# Patient Record
Sex: Female | Born: 1965 | Race: White | Hispanic: No | Marital: Married | Smoking: Former smoker
Health system: Southern US, Community
[De-identification: ages and names within clinical notes are randomized; demographics above are authoritative.]

## PROBLEM LIST (undated history)

## (undated) DIAGNOSIS — M797 Fibromyalgia: Secondary | ICD-10-CM

## (undated) DIAGNOSIS — F419 Anxiety disorder, unspecified: Secondary | ICD-10-CM

## (undated) DIAGNOSIS — M1990 Unspecified osteoarthritis, unspecified site: Secondary | ICD-10-CM

## (undated) DIAGNOSIS — I10 Essential (primary) hypertension: Secondary | ICD-10-CM

## (undated) DIAGNOSIS — Z9289 Personal history of other medical treatment: Secondary | ICD-10-CM

## (undated) DIAGNOSIS — K219 Gastro-esophageal reflux disease without esophagitis: Secondary | ICD-10-CM

## (undated) DIAGNOSIS — K589 Irritable bowel syndrome without diarrhea: Secondary | ICD-10-CM

## (undated) HISTORY — PX: ABDOMINAL HYSTERECTOMY: SHX81

## (undated) HISTORY — DX: Gastro-esophageal reflux disease without esophagitis: K21.9

## (undated) HISTORY — PX: CHOLECYSTECTOMY: SHX55

## (undated) HISTORY — DX: Irritable bowel syndrome, unspecified: K58.9

## (undated) HISTORY — PX: WISDOM TOOTH EXTRACTION: SHX21

## (undated) HISTORY — PX: TONSILLECTOMY: SUR1361

---

## 2001-10-05 ENCOUNTER — Ambulatory Visit (HOSPITAL_COMMUNITY): Admission: RE | Admit: 2001-10-05 | Discharge: 2001-10-05 | Payer: Self-pay | Admitting: General Surgery

## 2005-10-29 ENCOUNTER — Ambulatory Visit: Payer: Self-pay | Admitting: Gastroenterology

## 2005-12-23 ENCOUNTER — Ambulatory Visit: Payer: Self-pay | Admitting: Gastroenterology

## 2011-09-30 ENCOUNTER — Encounter (INDEPENDENT_AMBULATORY_CARE_PROVIDER_SITE_OTHER): Payer: Self-pay | Admitting: *Deleted

## 2011-11-08 ENCOUNTER — Encounter (INDEPENDENT_AMBULATORY_CARE_PROVIDER_SITE_OTHER): Payer: Self-pay | Admitting: *Deleted

## 2011-11-08 ENCOUNTER — Other Ambulatory Visit (INDEPENDENT_AMBULATORY_CARE_PROVIDER_SITE_OTHER): Payer: Self-pay | Admitting: *Deleted

## 2011-11-08 ENCOUNTER — Encounter (INDEPENDENT_AMBULATORY_CARE_PROVIDER_SITE_OTHER): Payer: Self-pay | Admitting: Internal Medicine

## 2011-11-08 ENCOUNTER — Ambulatory Visit (INDEPENDENT_AMBULATORY_CARE_PROVIDER_SITE_OTHER): Payer: 59 | Admitting: Internal Medicine

## 2011-11-08 VITALS — BP 98/60 | HR 72 | Temp 97.7°F | Ht 65.0 in | Wt 139.6 lb

## 2011-11-08 DIAGNOSIS — K219 Gastro-esophageal reflux disease without esophagitis: Secondary | ICD-10-CM

## 2011-11-08 DIAGNOSIS — K5900 Constipation, unspecified: Secondary | ICD-10-CM

## 2011-11-08 NOTE — Patient Instructions (Addendum)
Amitiza daily for your constipation. EGD. The risks and benefits such as perforation, bleeding, and infection were reviewed with the patient and is agreeable.  Stop the Miralax

## 2011-11-08 NOTE — Progress Notes (Signed)
Subjective:     Patient ID: Tiffany Stout, female   DOB: 05-26-1965, 45 y.o.   MRN: [id number]  HPI Referred to our office for acid reflux.  She says she has a bad taste in her mouth all the time.  She tells me she has food coming up into her esophagus. She is presently taking Zantac 300mg  BID and Dexilant daily and is still having acid reflux. She has been on several PPIs which have not helped. Acid reflux keeps her up at night.  She has frequent nausea.  Appetite is not good. No weight loss.  She does have early satiety. She is avoiding spicy foods, and chocolate. She is avoiding carbonated drinks.   She also takes Miralax. She tells me she stays constipated all the time.  No melena or bright red rectal bleeding.  She usually has a BM once every 1-2 weeks. She has been constipated for many years. Review of Systems drr hpi Current Outpatient Prescriptions  Medication Sig Dispense Refill  . dexlansoprazole (DEXILANT) 60 MG capsule Take 60 mg by mouth daily.      Marland Kitchen PARoxetine (PAXIL) 20 MG tablet Take 20 mg by mouth every morning.      . ranitidine (ZANTAC) 300 MG tablet Take 300 mg by mouth at bedtime.       Past Medical History  Diagnosis Date  . GERD (gastroesophageal reflux disease)   \ Past Surgical History  Procedure Date  . Cholecystectomy    History   Social History  . Marital Status: Single    Spouse Name: N/A    Number of Children: N/A  . Years of Education: N/A   Occupational History  . Not on file.   Social History Main Topics  . Smoking status: Never Smoker   . Smokeless tobacco: Not on file  . Alcohol Use: No  . Drug Use: No  . Sexually Active: Not on file   Other Topics Concern  . Not on file   Social History Narrative  . No narrative on file   Family Status  Relation Status Death Age  . Mother Alive     good health  . Father Deceased     Alzheimer's  . Brother Deceased     age 38 of a heart condition        Objective:   Physical Exam    Filed Vitals:   11/08/11 1112  BP: 98/60  Pulse: 72  Temp: 97.7 F (36.5 C)  Height: 5\' 5"  (1.651 m)  Weight: 139 lb 9.6 oz (63.322 kg)  Alert and oriented. Skin warm and dry. Oral mucosa is moist.   . Sclera anicteric, conjunctivae is pink. Thyroid not enlarged. No cervical lymphadenopathy. Lungs clear. Heart regular rate and rhythm.  Abdomen is soft. Bowel sounds are positive. No hepatomegaly. No abdominal masses felt. No tenderness.  No edema to lower extremities. Patient is alert and oriented.       Assessment:    GERD, uncontrolled. Has been on multiple PPIs in the past.   Constipation, chronic. Taking Miralax and MOM. Plan:    EGD. The risks and benefits such as perforation, bleeding, and infection were reviewed with the patient and is agreeable.  Amitiza given to patient.     I discussed this case with Dr. Karilyn Cota. If EGD is normal, will schedule a PH study.

## 2011-11-10 ENCOUNTER — Encounter (HOSPITAL_COMMUNITY): Payer: Self-pay | Admitting: Pharmacy Technician

## 2011-11-11 ENCOUNTER — Encounter (HOSPITAL_COMMUNITY)
Admission: RE | Disposition: A | Discharge status: Home / Self Care | Payer: Self-pay | Source: Ambulatory Visit | Attending: Internal Medicine

## 2011-11-11 ENCOUNTER — Encounter (HOSPITAL_COMMUNITY): Payer: Self-pay | Admitting: *Deleted

## 2011-11-11 ENCOUNTER — Ambulatory Visit (HOSPITAL_COMMUNITY)
Admission: RE | Admit: 2011-11-11 | Discharge: 2011-11-11 | Disposition: A | Discharge status: Home / Self Care | Payer: 59 | Source: Ambulatory Visit | Attending: Internal Medicine | Admitting: Internal Medicine

## 2011-11-11 DIAGNOSIS — R12 Heartburn: Secondary | ICD-10-CM | POA: Insufficient documentation

## 2011-11-11 DIAGNOSIS — K219 Gastro-esophageal reflux disease without esophagitis: Secondary | ICD-10-CM

## 2011-11-11 DIAGNOSIS — R110 Nausea: Secondary | ICD-10-CM | POA: Insufficient documentation

## 2011-11-11 DIAGNOSIS — K2940 Chronic atrophic gastritis without bleeding: Secondary | ICD-10-CM | POA: Insufficient documentation

## 2011-11-11 DIAGNOSIS — K449 Diaphragmatic hernia without obstruction or gangrene: Secondary | ICD-10-CM | POA: Insufficient documentation

## 2011-11-11 DIAGNOSIS — K2960 Other gastritis without bleeding: Secondary | ICD-10-CM

## 2011-11-11 HISTORY — PX: ESOPHAGOGASTRODUODENOSCOPY: SHX5428

## 2011-11-11 SURGERY — EGD (ESOPHAGOGASTRODUODENOSCOPY)
Anesthesia: Moderate Sedation

## 2011-11-11 MED ORDER — MIDAZOLAM HCL 5 MG/5ML IJ SOLN
INTRAMUSCULAR | Status: DC | PRN
  Administered 2011-11-11 (×5): 2 mg via INTRAVENOUS

## 2011-11-11 MED ORDER — SUCRALFATE 1 GM/10ML PO SUSP: 1.0000 g | Freq: Four times a day (QID) | ORAL | Status: DC

## 2011-11-11 MED ORDER — STERILE WATER FOR IRRIGATION IR SOLN
Status: DC | PRN
  Administered 2011-11-11: 15:00:00

## 2011-11-11 MED ORDER — METOCLOPRAMIDE HCL 10 MG PO TABS: 10.0000 mg | ORAL_TABLET | Freq: Three times a day (TID) | ORAL | Status: DC

## 2011-11-11 MED ORDER — MIDAZOLAM HCL 5 MG/5ML IJ SOLN
INTRAMUSCULAR | Status: AC
  Filled 2011-11-11: qty 10

## 2011-11-11 MED ORDER — MEPERIDINE HCL 50 MG/ML IJ SOLN
INTRAMUSCULAR | Status: AC
  Filled 2011-11-11: qty 1

## 2011-11-11 MED ORDER — BUTAMBEN-TETRACAINE-BENZOCAINE 2-2-14 % EX AERO
INHALATION_SPRAY | CUTANEOUS | Status: DC | PRN
  Administered 2011-11-11: 2 via TOPICAL

## 2011-11-11 MED ORDER — SODIUM CHLORIDE 0.45 % IV SOLN
INTRAVENOUS | Status: DC
  Administered 2011-11-11: 1000 mL via INTRAVENOUS

## 2011-11-11 MED ORDER — MEPERIDINE HCL 25 MG/ML IJ SOLN
INTRAMUSCULAR | Status: DC | PRN
  Administered 2011-11-11 (×2): 25 mg via INTRAVENOUS

## 2011-11-11 NOTE — Op Note (Signed)
EGD PROCEDURE REPORT  PATIENT:  Tiffany Stout  MR#:  [id number] Birthdate:  [birth year], 46 y.o., female Endoscopist:  Dr. Malissa Hippo, MD Referred By:  Dr. Ignatius Specking, MD Procedure Date: 11/11/2011  Procedure:   EGD  Indications:  Patient is 46 year old Caucasian female  who presents with recurrent heartburn and regurgitation. She is not responding to therapy for GERD. She has had cholecystectomy few years ago.           Informed Consent:  The risks, benefits, alternatives & imponderables which include, but are not limited to, bleeding, infection, perforation, drug reaction and potential missed lesion have been reviewed.  The potential for biopsy, lesion removal, esophageal dilation, etc. have also been discussed.  Questions have been answered.  All parties agreeable.  Please see history & physical in medical record for more information.  Medications:  Demerol 50 mg IV Versed 10 mg IV Cetacaine spray topically for oropharyngeal anesthesia  Description of procedure:  The endoscope was introduced through the mouth and advanced to the second portion of the duodenum without difficulty or limitations. The mucosal surfaces were surveyed very carefully during advancement of the scope and upon withdrawal.  Findings:  Esophagus:  Mucosa of the esophagus was normal. Focal erythema noted at GE junction but no erosions or ulcers present. GEJ:  33 cm Hiatus:  35 cm Stomach:  Stomach was empty and distended very well with insufflation. Folds in the proximal stomach were normal. Examination of mucosa at body was normal. Patchy erythema and granularity noted at antrum but no erosions or ulcers present. Pyloric channel was patent. Angularis fundus and cardia were examined by retroflexing the scope and were normal. Duodenum:  Normal bulbar and post bulbar mucosa.  Therapeutic/Diagnostic Maneuvers Performed:  None   Complications:  None  Impression: Small sliding hiatal hernia along with  mild changes of reflux esophagitis limited to GE junction. Nonerosive antral gastritis.  Recommendations:  Continue anti-reflux measures. Continued dexilant but discontinue ranitidine. Carafate 1 g by mouth a.c. and each bedtime. Metoclopromide 10 mg by mouth a.c. H. pylori serology.  REHMAN,NAJEEB U  11/11/2011  3:45 PM  CC: Dr. Ignatius Specking., MD & Dr. Bonnetta Barry ref. provider found

## 2011-11-11 NOTE — H&P (Signed)
Tiffany Stout is an 46 y.o. female.   Chief Complaint: Patient is for esophagogastroduodenoscopy. HPI: Patient is 46 year old Caucasian female presented intractable symptoms of retrosternal burning frequent regurgitation. She was diagnosed with GERD about 3 years ago. She was on Prilosec for a while at work but her symptoms have been intractable for the last 6 months. She does not smoke cigarettes. She's been watching her diet as recommended. He denies dysphagia or vomiting but she does complain of nausea. She also complains of constipation. She says Amitiza is helping. She denies weight loss melena or rectal bleeding.  Past Medical History  Diagnosis Date  . GERD (gastroesophageal reflux disease)     Past Surgical History  Procedure Date  . Cholecystectomy   . Abdominal hysterectomy     History reviewed. No pertinent family history. Social History:  reports that she has never smoked. She does not have any smokeless tobacco history on file. She reports that she does not drink alcohol or use illicit drugs.  Allergies: No Known Allergies  Medications Prior to Admission  Medication Sig Dispense Refill  . dexlansoprazole (DEXILANT) 60 MG capsule Take 60 mg by mouth daily.      Marland Kitchen PARoxetine (PAXIL) 20 MG tablet Take 20 mg by mouth every morning.      . ranitidine (ZANTAC) 300 MG tablet Take 300 mg by mouth 2 (two) times daily.       . traZODone (DESYREL) 50 MG tablet Take 50 mg by mouth at bedtime.        No results found for this or any previous visit (from the past 48 hour(s)). No results found.  ROS  Blood pressure 132/93, pulse 80, temperature 98.2 F (36.8 C), temperature source Oral, resp. rate 18, height 5\' 5"  (1.651 m), weight 139 lb (63.05 kg), SpO2 98.00%. Physical Exam  Constitutional: She appears well-developed and well-nourished.  HENT:  Mouth/Throat: Oropharynx is clear and moist.  Eyes: Conjunctivae normal are normal. No scleral icterus.  Neck: No thyromegaly  present.  Cardiovascular: Normal rate, regular rhythm and normal heart sounds.   No murmur heard. Respiratory: Effort normal and breath sounds normal.  GI: She exhibits no distension and no mass. There is no tenderness.  Musculoskeletal: She exhibits no edema.  Lymphadenopathy:    She has no cervical adenopathy.  Neurological: She is alert.  Skin: Skin is warm and dry.     Assessment/Plan Refractory GERD. Diagnostic EGD.  Ra Pfiester U 11/11/2011, 3:19 PM

## 2011-11-12 LAB — H. PYLORI ANTIBODY, IGG: H Pylori IgG: 0.45 {ISR}

## 2011-11-13 ENCOUNTER — Telehealth: Payer: Self-pay | Admitting: Internal Medicine

## 2011-11-13 NOTE — Telephone Encounter (Signed)
Husband called me this afternoon; pt started metoclopramide after recent EGD; he reports pt has become "jittery" and "bouncing off the wall" after starting med; not functional - had to leave work today because of Sx; I recommended pt stop metoclopramide and never take it again- follow up with Dr. Karilyn Cota the first of next week

## 2011-11-15 ENCOUNTER — Ambulatory Visit (INDEPENDENT_AMBULATORY_CARE_PROVIDER_SITE_OTHER): Payer: Self-pay | Admitting: Internal Medicine

## 2011-11-17 ENCOUNTER — Encounter (HOSPITAL_COMMUNITY): Payer: Self-pay | Admitting: Internal Medicine

## 2011-11-17 ENCOUNTER — Encounter (INDEPENDENT_AMBULATORY_CARE_PROVIDER_SITE_OTHER): Payer: Self-pay

## 2011-11-22 ENCOUNTER — Telehealth (INDEPENDENT_AMBULATORY_CARE_PROVIDER_SITE_OTHER): Payer: Self-pay | Admitting: *Deleted

## 2011-11-22 NOTE — Telephone Encounter (Signed)
Tiffany Stout called and would like to know how her procedure went on 11/11/11. Her husband didn't remember what Dr. Karilyn Cota had said and has not received the results.  The return phone number is [phone].

## 2011-11-24 NOTE — Telephone Encounter (Signed)
Patient was called at home. Husband states that she has been calling numerous times but gets answering machines. Refer to patient's documents to see that messages have been left for the patient. I called patient's cell phone and spoke with her and went over procedure,findings.recommendations and the H-Pylori Serology. She was also given my number to call if she had other questions.

## 2012-02-14 ENCOUNTER — Telehealth (INDEPENDENT_AMBULATORY_CARE_PROVIDER_SITE_OTHER): Payer: Self-pay | Admitting: Internal Medicine

## 2012-02-14 NOTE — Telephone Encounter (Signed)
Called requesting Amitiza samples. Given samples last visit in October. Rx written for Amitiza one a day with 5 refills. Dorene Ar

## 2012-11-18 ENCOUNTER — Emergency Department (HOSPITAL_COMMUNITY)
Admission: EM | Admit: 2012-11-18 | Discharge: 2012-11-18 | Disposition: A | Discharge status: Home / Self Care | Payer: 59 | Attending: Emergency Medicine | Admitting: Emergency Medicine

## 2012-11-18 ENCOUNTER — Encounter (HOSPITAL_COMMUNITY): Payer: Self-pay | Admitting: Emergency Medicine

## 2012-11-18 ENCOUNTER — Emergency Department (HOSPITAL_COMMUNITY): Payer: 59

## 2012-11-18 DIAGNOSIS — F411 Generalized anxiety disorder: Secondary | ICD-10-CM | POA: Insufficient documentation

## 2012-11-18 DIAGNOSIS — M545 Low back pain, unspecified: Secondary | ICD-10-CM | POA: Insufficient documentation

## 2012-11-18 DIAGNOSIS — M549 Dorsalgia, unspecified: Secondary | ICD-10-CM

## 2012-11-18 DIAGNOSIS — E876 Hypokalemia: Secondary | ICD-10-CM | POA: Insufficient documentation

## 2012-11-18 DIAGNOSIS — Z8719 Personal history of other diseases of the digestive system: Secondary | ICD-10-CM | POA: Insufficient documentation

## 2012-11-18 LAB — COMPREHENSIVE METABOLIC PANEL
Albumin: 4.2 g/dL (ref 3.5–5.2)
Alkaline Phosphatase: 86 U/L (ref 39–117)
BUN: 7 mg/dL (ref 6–23)
Creatinine, Ser: 0.56 mg/dL (ref 0.50–1.10)
GFR calc Af Amer: >90 mL/min (ref >90)
Glucose, Bld: 94 mg/dL (ref 70–99)
Potassium: 3.2 mEq/L — ABNORMAL LOW (ref 3.5–5.1)
Total Bilirubin: 0.6 mg/dL (ref 0.3–1.2)
Total Protein: 8 g/dL (ref 6.0–8.3)

## 2012-11-18 LAB — URINALYSIS, ROUTINE W REFLEX MICROSCOPIC
Bilirubin Urine: NEGATIVE
Glucose, UA: NEGATIVE mg/dL
Ketones, ur: NEGATIVE mg/dL
Nitrite: NEGATIVE
Specific Gravity, Urine: 1.01 (ref 1.005–1.030)
pH: 6 (ref 5.0–8.0)

## 2012-11-18 LAB — CBC WITH DIFFERENTIAL/PLATELET
Basophils Relative: 0 % (ref 0–1)
Eosinophils Absolute: 0.1 10*3/uL (ref 0.0–0.7)
HCT: 41.9 % (ref 36.0–46.0)
Hemoglobin: 14.1 g/dL (ref 12.0–15.0)
Lymphs Abs: 1.8 10*3/uL (ref 0.7–4.0)
MCH: 31.7 pg (ref 26.0–34.0)
MCHC: 33.7 g/dL (ref 30.0–36.0)
MCV: 94.2 fL (ref 78.0–100.0)
Monocytes Absolute: 0.5 10*3/uL (ref 0.1–1.0)
Monocytes Relative: 9 % (ref 3–12)
Neutrophils Relative %: 56 % (ref 43–77)
RBC: 4.45 MIL/uL (ref 3.87–5.11)

## 2012-11-18 LAB — URINE MICROSCOPIC-ADD ON

## 2012-11-18 LAB — LIPASE, BLOOD: Lipase: 21 U/L (ref 11–59)

## 2012-11-18 MED ORDER — SODIUM CHLORIDE 0.9 % IV SOLN
INTRAVENOUS | Status: DC
  Administered 2012-11-18: 21:00:00 via INTRAVENOUS

## 2012-11-18 MED ORDER — IOHEXOL 300 MG/ML  SOLN
100.0000 mL | Freq: Once | INTRAMUSCULAR | Status: AC | PRN
  Administered 2012-11-18: 100 mL via INTRAVENOUS

## 2012-11-18 MED ORDER — LORAZEPAM 2 MG/ML IJ SOLN
1.0000 mg | Freq: Once | INTRAMUSCULAR | Status: AC
  Administered 2012-11-18: 1 mg via INTRAVENOUS
  Filled 2012-11-18: qty 1

## 2012-11-18 MED ORDER — HYDROCODONE-ACETAMINOPHEN 5-325 MG PO TABS
1.0000 | ORAL_TABLET | Freq: Once | ORAL | Status: AC
  Administered 2012-11-18: 1 via ORAL
  Filled 2012-11-18: qty 1

## 2012-11-18 MED ORDER — HYDROCODONE-ACETAMINOPHEN 5-325 MG PO TABS: 1.0000 | ORAL_TABLET | ORAL | Status: DC | PRN

## 2012-11-18 MED ORDER — POTASSIUM CHLORIDE CRYS ER 20 MEQ PO TBCR: 20.0000 meq | EXTENDED_RELEASE_TABLET | Freq: Two times a day (BID) | ORAL | Status: DC

## 2012-11-18 MED ORDER — IOHEXOL 300 MG/ML  SOLN
50.0000 mL | Freq: Once | INTRAMUSCULAR | Status: AC | PRN
  Administered 2012-11-18: 50 mL via ORAL

## 2012-11-18 MED ORDER — KETOROLAC TROMETHAMINE 30 MG/ML IJ SOLN
30.0000 mg | Freq: Once | INTRAMUSCULAR | Status: AC
  Administered 2012-11-18: 30 mg via INTRAVENOUS
  Filled 2012-11-18: qty 1

## 2012-11-18 NOTE — ED Notes (Signed)
To room with discharge instructions.  Patient asking for something else for pain.  States meds earlier "just knocked the edge off the pain"  MD - Pickering informed.

## 2012-11-18 NOTE — ED Notes (Signed)
Bilateral flank pain. Unable to sleep at night because pain worsens at night. Pain x 1 week. States "a little nausea, but not bad" Denies urinary or any other symptoms.

## 2012-11-18 NOTE — ED Provider Notes (Signed)
CT scan reviewed with patient. Has renal cysts and some thoracic degeneration but no clear cause of pain. Will discharge home with pain medicine. We'll also give some potassium for her hypokalemia. His been asked about pain management, which may be a good idea for the patient.  Juliet Rude. Rubin Payor, MD 11/18/12 2258

## 2012-11-18 NOTE — ED Provider Notes (Signed)
CSN: [id number]     Arrival date & time 11/18/12  1802 History   First MD Initiated Contact with Patient 11/18/12 2001     Chief Complaint  Patient presents with  . Flank Pain   (Consider location/radiation/quality/duration/timing/severity/associated sxs/prior Treatment) HPI  Patient reports for the past 2 weeks she's had bilateral back pain that radiates around to her upper abdomen and lower chest area and along the sides of her abdomen. She states the pain is constant. She states however it hurts more at night. She states nothing makes it feel worse including changing positions, deep breathing, coughing. She states using a heating pad only helps temporarily. She states the pain is mainly dull but at times can be sharp. She's had nausea without vomiting. She has had loss of appetite and has lost 4 pounds. She has diarrhea but she has chronic underlying IBS and that is not changed. She denies fever, dysuria, hematuria, cough, shortness of breath, rectal bleeding. She states she does have some frequency. She states she's had this before and was told she had degenerative disc disease of her back. She saw her family practice doctor 8 days ago and was given a anti-inflammatory without relief. Husband states she's under a lot of stress at which point she states she's not. However he points out she is going to be losing her job of 20 years. On further discussion he states she's had this pain "for years and that normally hydrocodone works well for her but her doctor will not write it for her anymore".   PCP Dr Sherril Croon  Past Medical History  Diagnosis Date  . GERD (gastroesophageal reflux disease)    Past Surgical History  Procedure Laterality Date  . Cholecystectomy    . Abdominal hysterectomy    . Esophagogastroduodenoscopy  11/11/2011    Procedure: ESOPHAGOGASTRODUODENOSCOPY (EGD);  Surgeon: Malissa Hippo, MD;  Location: [facility];  Service: Endoscopy;  Laterality: N/A;  315   No family  history on file. History  Substance Use Topics  . Smoking status: Never Smoker   . Smokeless tobacco: Not on file  . Alcohol Use: No   Lives at home Lives with spouse Employed part time and states they are doing away with her job after 20 years.    OB History   Grav Para Term Preterm Abortions TAB SAB Ect Mult Living                 Review of Systems  All other systems reviewed and are negative.    Allergies  Review of patient's allergies indicates no known allergies.  Home Medications   Current Outpatient Rx  Name  Route  Sig  Dispense  Refill  . traZODone (DESYREL) 50 MG tablet   Oral   Take 50 mg by mouth at bedtime.          BP 123/96  Pulse 72  Temp(Src) 98.1 F (36.7 C) (Oral)  Resp 16  Ht 5\' 5"  (1.651 m)  Wt 126 lb (57.153 kg)  BMI 20.97 kg/m2  SpO2 100%  Vital signs normal   Physical Exam  Nursing note and vitals reviewed. Constitutional: She is oriented to person, place, and time. She appears well-developed and well-nourished.  Non-toxic appearance. She does not appear ill. No distress.  HENT:  Head: Normocephalic and atraumatic.  Right Ear: External ear normal.  Left Ear: External ear normal.  Nose: Nose normal. No mucosal edema or rhinorrhea.  Mouth/Throat: Oropharynx is clear and moist and  mucous membranes are normal. No dental abscesses or uvula swelling.  Eyes: Conjunctivae and EOM are normal. Pupils are equal, round, and reactive to light.  Neck: Normal range of motion and full passive range of motion without pain. Neck supple.  Cardiovascular: Normal rate, regular rhythm and normal heart sounds.  Exam reveals no gallop and no friction rub.   No murmur heard. Pulmonary/Chest: Effort normal and breath sounds normal. No respiratory distress. She has no wheezes. She has no rhonchi. She has no rales. She exhibits no tenderness and no crepitus.  Patient has mild tenderness of her lower rib cage bilaterally without crepitance or bruising.   Abdominal: Soft. Normal appearance and bowel sounds are normal. She exhibits no distension. There is no tenderness. There is no rebound and no guarding.  Nontender abdomen to palpation  Musculoskeletal: Normal range of motion. She exhibits no edema and no tenderness.  Moves all extremities well.  Nontender spine to palpation including thoracic and lumbar. She is nontender over the SI joints. She has free range of motion of her waist and back without any pain. She indicates she has some discomfort in her paraspinous muscles in the lumbar area but they do not hurt on range of motion.  Neurological: She is alert and oriented to person, place, and time. She has normal strength. No cranial nerve deficit.  Skin: Skin is warm, dry and intact. No rash noted. No erythema. No pallor.  Psychiatric: Her speech is normal and behavior is normal. Her mood appears anxious.    ED Course  Procedures (including critical care time) Medications  0.9 %  sodium chloride infusion ( Intravenous New Bag/Given 11/18/12 2044)  ketorolac (TORADOL) 30 MG/ML injection 30 mg (30 mg Intravenous Given 11/18/12 2046)  LORazepam (ATIVAN) injection 1 mg (1 mg Intravenous Given 11/18/12 2045)  iohexol (OMNIPAQUE) 300 MG/ML solution 50 mL (50 mLs Oral Contrast Given 11/18/12 2137)  iohexol (OMNIPAQUE) 300 MG/ML solution 100 mL (100 mLs Intravenous Contrast Given 11/18/12 2137)    [place] database shows only has 2 scripts for clonazepam but not in the past 3 months.  Pt turned over to Dr Rubin Payor at change of shift to get CT results.    Labs Review Results for orders placed during the hospital encounter of 11/18/12  URINALYSIS, ROUTINE W REFLEX MICROSCOPIC      Result Value Range   Color, Urine ORANGE (*) YELLOW   APPearance CLEAR  CLEAR   Specific Gravity, Urine 1.010  1.005 - 1.030   pH 6.0  5.0 - 8.0   Glucose, UA NEGATIVE  NEGATIVE mg/dL   Hgb urine dipstick SMALL (*) NEGATIVE   Bilirubin Urine NEGATIVE  NEGATIVE    Ketones, ur NEGATIVE  NEGATIVE mg/dL   Protein, ur NEGATIVE  NEGATIVE mg/dL   Urobilinogen, UA 0.2  0.0 - 1.0 mg/dL   Nitrite NEGATIVE  NEGATIVE   Leukocytes, UA NEGATIVE  NEGATIVE  CBC WITH DIFFERENTIAL      Result Value Range   WBC 5.5  4.0 - 10.5 K/uL   RBC 4.45  3.87 - 5.11 MIL/uL   Hemoglobin 14.1  12.0 - 15.0 g/dL   HCT 16.1  09.6 - 04.5 %   MCV 94.2  78.0 - 100.0 fL   MCH 31.7  26.0 - 34.0 pg   MCHC 33.7  30.0 - 36.0 g/dL   RDW 40.9  81.1 - 91.4 %   Platelets 171  150 - 400 K/uL   Neutrophils Relative % 56  43 -  77 %   Neutro Abs 3.1  1.7 - 7.7 K/uL   Lymphocytes Relative 33  12 - 46 %   Lymphs Abs 1.8  0.7 - 4.0 K/uL   Monocytes Relative 9  3 - 12 %   Monocytes Absolute 0.5  0.1 - 1.0 K/uL   Eosinophils Relative 1  0 - 5 %   Eosinophils Absolute 0.1  0.0 - 0.7 K/uL   Basophils Relative 0  0 - 1 %   Basophils Absolute 0.0  0.0 - 0.1 K/uL  COMPREHENSIVE METABOLIC PANEL      Result Value Range   Sodium 138  135 - 145 mEq/L   Potassium 3.2 (*) 3.5 - 5.1 mEq/L   Chloride 101  96 - 112 mEq/L   CO2 26  19 - 32 mEq/L   Glucose, Bld 94  70 - 99 mg/dL   BUN 7  6 - 23 mg/dL   Creatinine, Ser 4.78  0.50 - 1.10 mg/dL   Calcium 9.3  8.4 - 29.5 mg/dL   Total Protein 8.0  6.0 - 8.3 g/dL   Albumin 4.2  3.5 - 5.2 g/dL   AST 21  0 - 37 U/L   ALT 15  0 - 35 U/L   Alkaline Phosphatase 86  39 - 117 U/L   Total Bilirubin 0.6  0.3 - 1.2 mg/dL   GFR calc non Af Amer >90  >90 mL/min   GFR calc Af Amer >90  >90 mL/min  LIPASE, BLOOD      Result Value Range   Lipase 21  11 - 59 U/L  URINE MICROSCOPIC-ADD ON      Result Value Range   Squamous Epithelial / LPF FEW (*) RARE   WBC, UA 3-6  <3 WBC/hpf   RBC / HPF 3-6  <3 RBC/hpf   Laboratory interpretation all normal except mild hypokalemia    Imaging Review CT [facility] pending.     MDM   1. Back pain   2. Hypokalemia with normal acid-base balance     Disposition per Dr Lorene Dy, MD, Franz Dell,  MD 11/18/12 2208

## 2014-10-06 IMAGING — CT CT ABD-PELV W/ CM
2 of 4 series · 13 of 36 positions shown, 16 images · IV contrast (Omnipaque 300)
Comparison: None.

CLINICAL DATA: Bilateral flank and upper abdominal pain. Back pain.

EXAM:
CT CHEST, ABDOMEN, AND PELVIS WITH CONTRAST
TECHNIQUE: Multidetector CT imaging of the chest, abdomen and pelvis was
performed following the standard protocol during bolus
administration of intravenous contrast.
CONTRAST:  100mL OMNIPAQUE IOHEXOL 300 MG/ML SOLN, 50mL OMNIPAQUE
IOHEXOL 300 MG/ML SOLN

[Series 2: cap with 5.0 b40f · axial · 0.68mm/px · z∈[-580,-25]mm · 10 of 125 slices shown, 13 images]
[im 7/125  mediastinal]
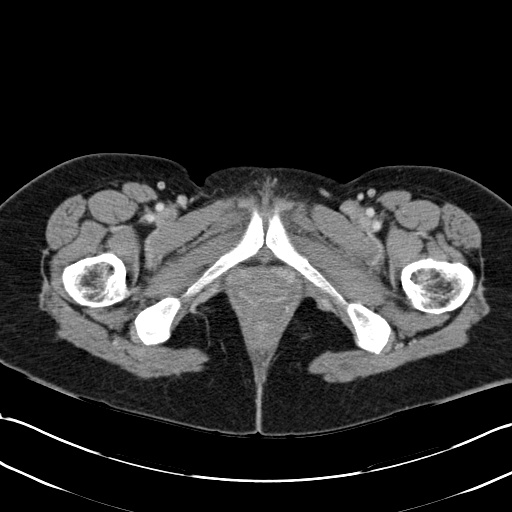
[im 7/125  lung]
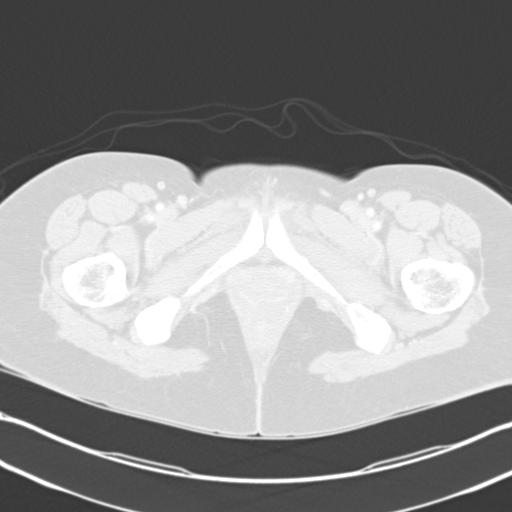
[im 19/125  lung]
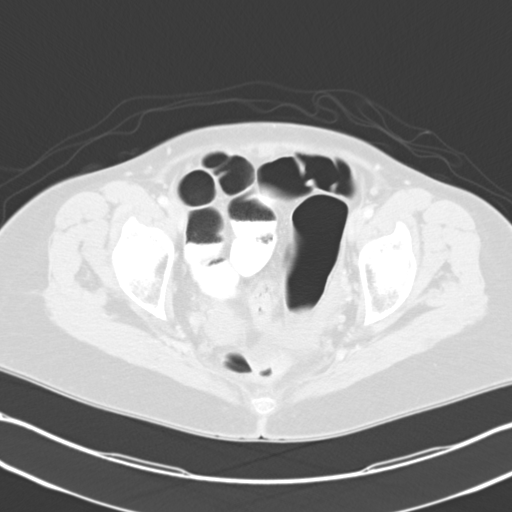
[im 32/125  lung]
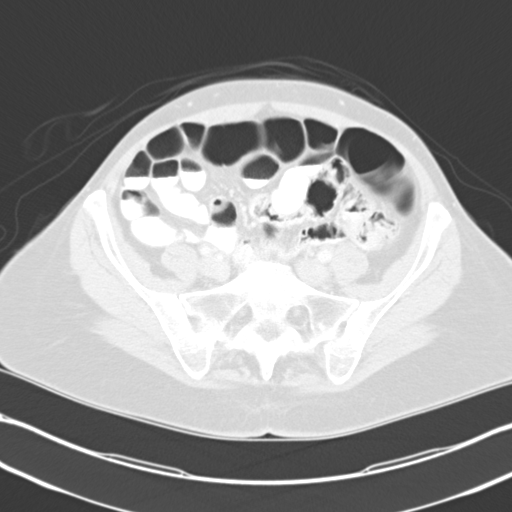
[im 44/125  lung]
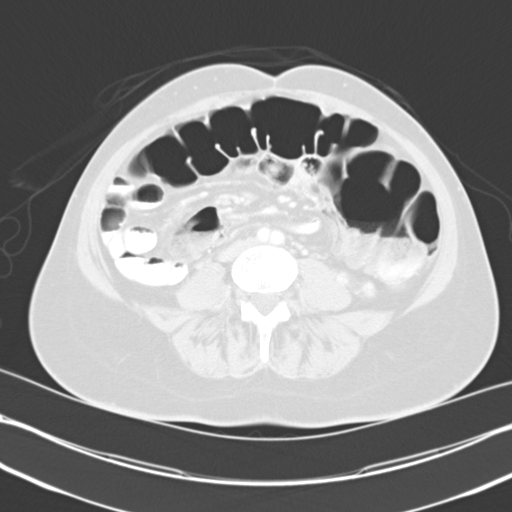
[im 56/125  mediastinal]
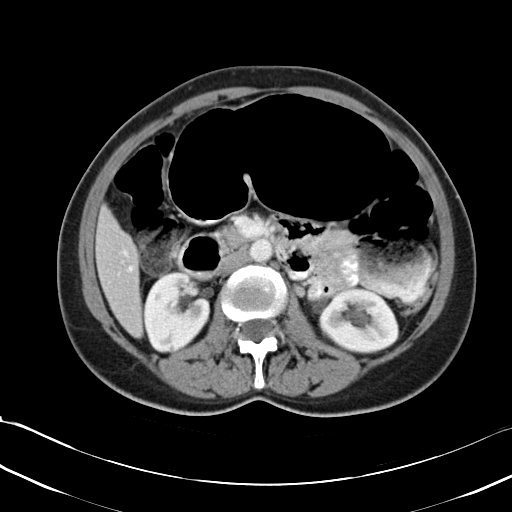
[im 56/125  lung]
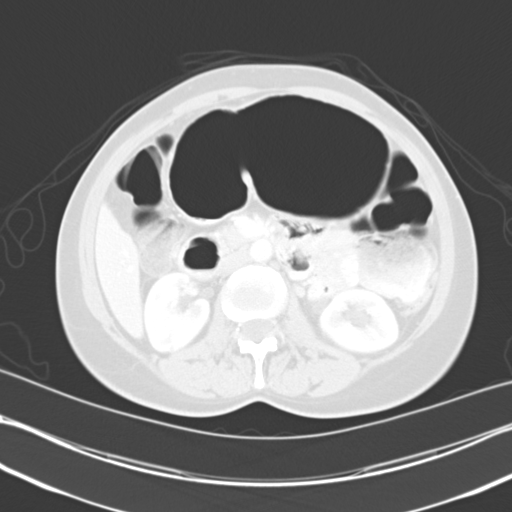
[im 69/125  lung]
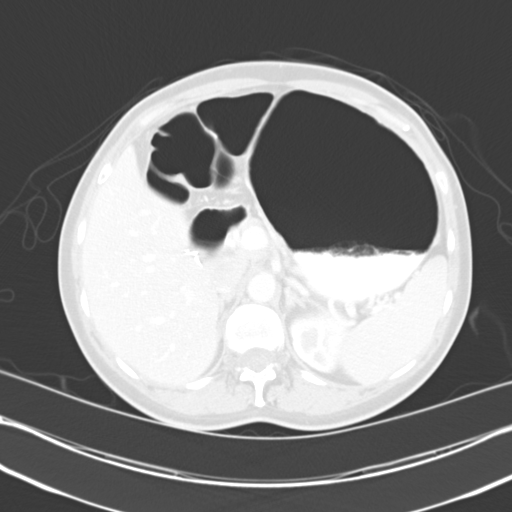
[im 81/125  lung]
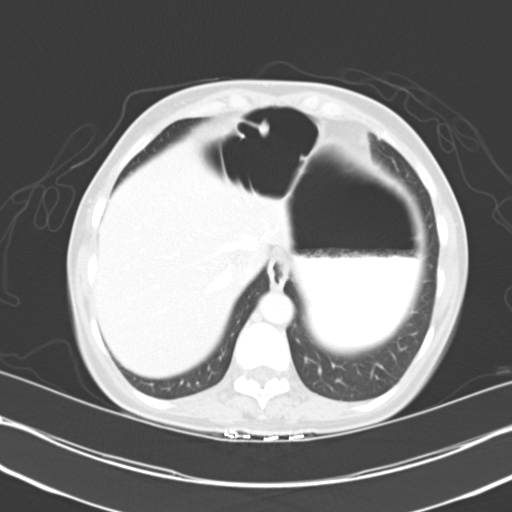
[im 94/125  lung]
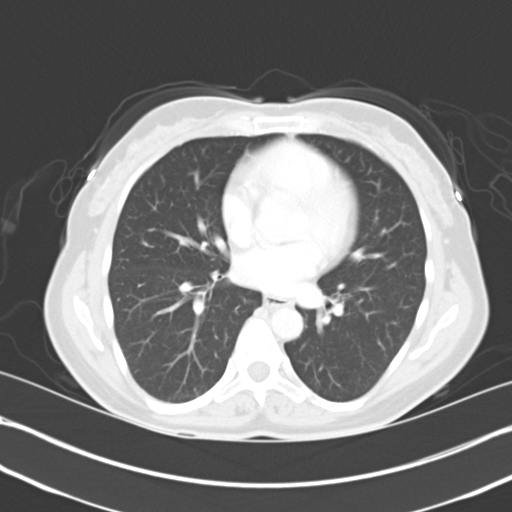
[im 106/125  mediastinal]
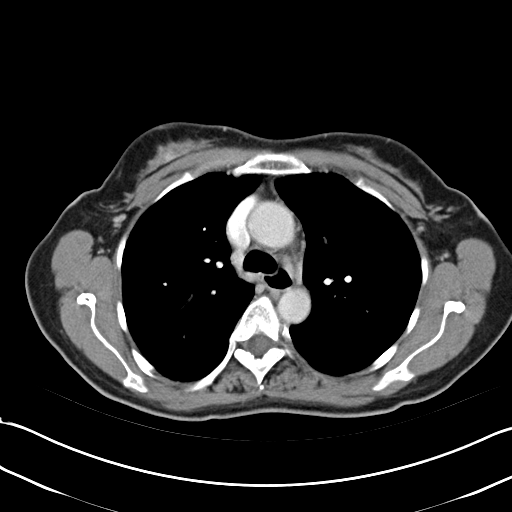
[im 106/125  lung]
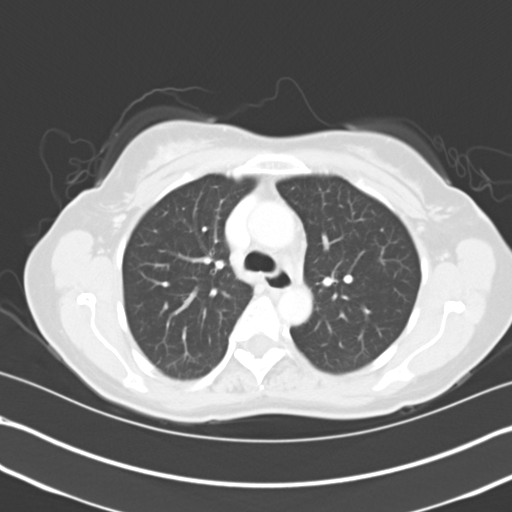
[im 118/125  lung]
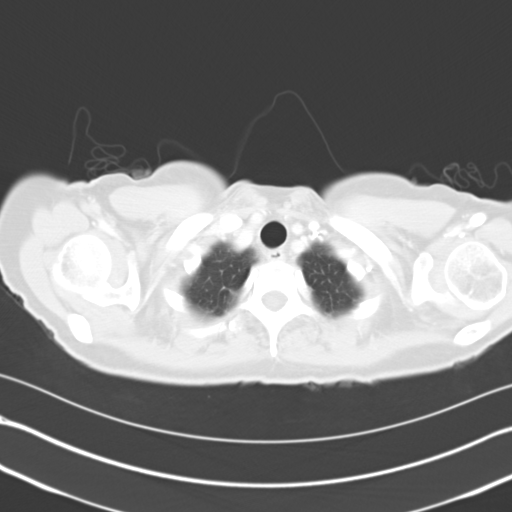

[Series 4: mpr cor post contrast (id) · coronal · 0.55mm/px · 3 of 82 slices shown]
[im 17/82  lung]
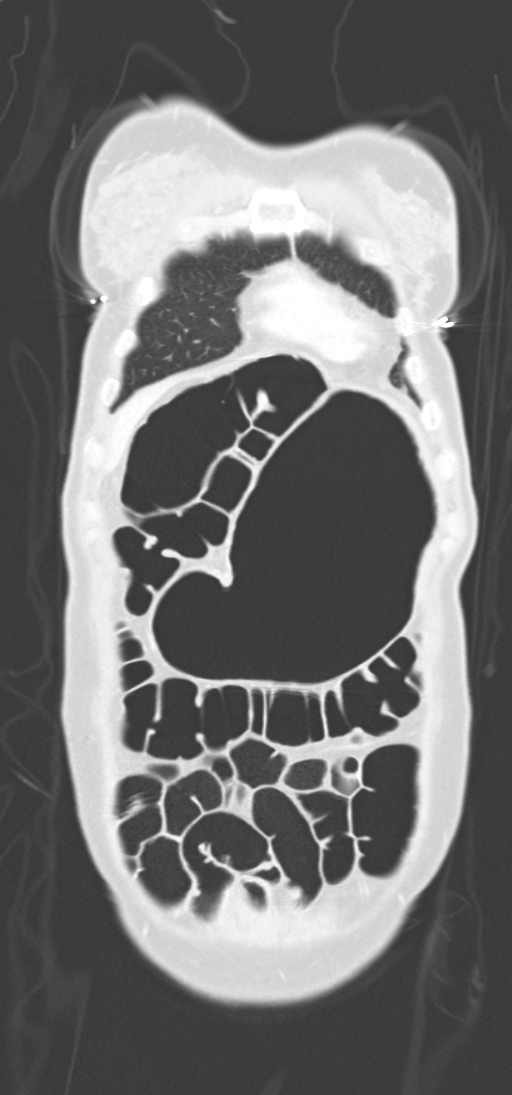
[im 33/82  lung]
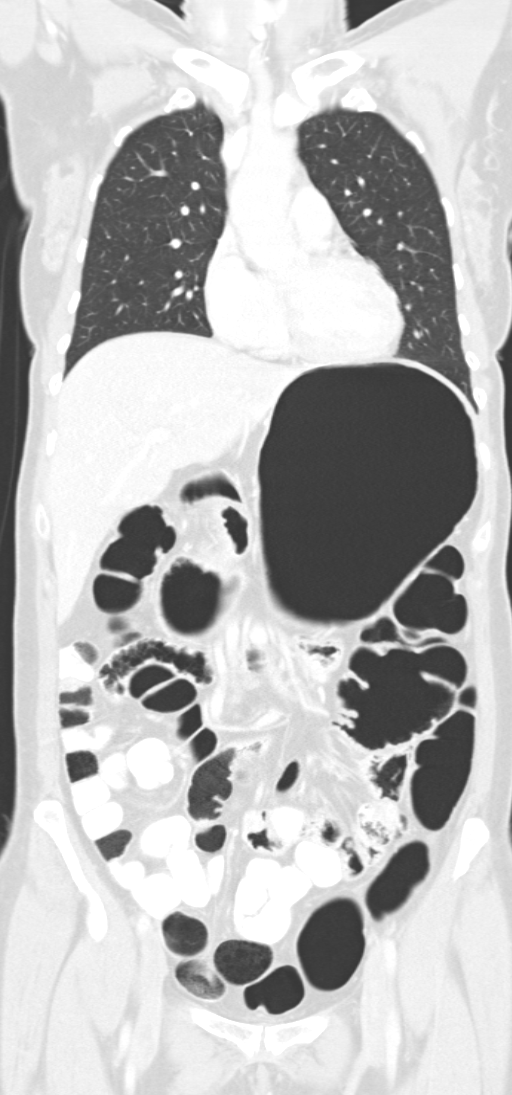
[im 49/82  lung]
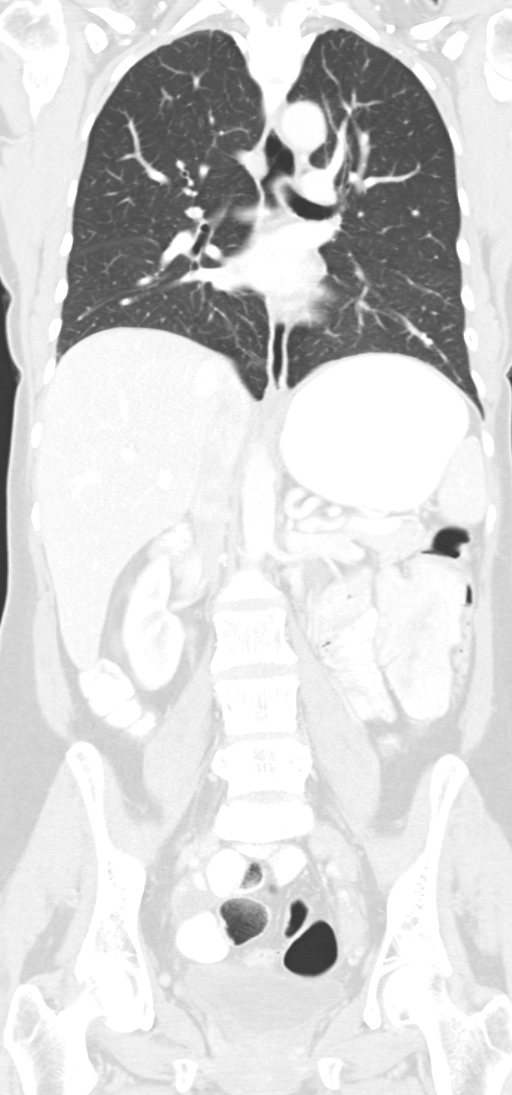

[13 of 36 positions shown; findings below may reference images not displayed]

FINDINGS: CT CHEST FINDINGS

Normal-appearing thoracic aorta without aneurysm or evidence of
dissection. Clear lungs. No lung masses or enlarged lymph nodes. No
pleural fluid. Mild scoliosis. Mild thoracic spine degenerative
changes.

CT ABDOMEN AND PELVIS FINDINGS

Large parapelvic left renal cyst measuring 4.5 cm on delayed image
number 21. Small right renal cysts. Cholecystectomy clips.

Unremarkable liver, spleen, pancreas, adrenal glands and urinary
bladder. 3.0 cm and 2.4 cm right ovarian cysts. These have CT
features of simple cysts. 9 mm left ovarian cyst. Dilated stomach
containing air and contrast. Mild diffuse prominence of the colon
and small bowel with no wall thickening or pneumatosis. No enlarged
lymph nodes. No evidence of appendicitis. Mild lumbar spine
degenerative changes.
IMPRESSION: 1. Bilateral renal cysts, as described above.
2. 3.0 cm in 2.4 cm right ovarian cysts and a mm left ovarian cyst.
These did not require followup. This recommendation follows ACR
consensus guidelines: White Paper of the ACR Incidental Findings
Committee II on Adnexal Findings. [HOSPITAL] [DATE].
3. Gastric distention and mild diffuse prominence of the colon and
small bowel.

## 2017-01-13 ENCOUNTER — Encounter (INDEPENDENT_AMBULATORY_CARE_PROVIDER_SITE_OTHER): Payer: Self-pay | Admitting: Internal Medicine

## 2017-01-13 ENCOUNTER — Encounter (INDEPENDENT_AMBULATORY_CARE_PROVIDER_SITE_OTHER): Payer: Self-pay

## 2017-01-25 ENCOUNTER — Ambulatory Visit (INDEPENDENT_AMBULATORY_CARE_PROVIDER_SITE_OTHER): Payer: Self-pay | Admitting: Internal Medicine

## 2017-02-02 ENCOUNTER — Ambulatory Visit (INDEPENDENT_AMBULATORY_CARE_PROVIDER_SITE_OTHER): Payer: 59 | Admitting: Internal Medicine

## 2017-02-02 ENCOUNTER — Encounter (INDEPENDENT_AMBULATORY_CARE_PROVIDER_SITE_OTHER): Payer: Self-pay | Admitting: Internal Medicine

## 2017-02-02 VITALS — BP 130/84 | HR 60 | Temp 97.9°F | Wt 140.6 lb

## 2017-02-02 DIAGNOSIS — K219 Gastro-esophageal reflux disease without esophagitis: Secondary | ICD-10-CM

## 2017-02-02 MED ORDER — DICYCLOMINE HCL 10 MG PO CAPS: 10.0000 mg | ORAL_CAPSULE | Freq: Three times a day (TID) | ORAL | 3 refills | Status: DC

## 2017-02-02 NOTE — Patient Instructions (Addendum)
Gastroesophageal Reflux Disease, Adult Normally, food travels down the esophagus and stays in the stomach to be digested. If a person has gastroesophageal reflux disease (GERD), food and stomach acid move back up into the esophagus. When this happens, the esophagus becomes sore and swollen (inflamed). Over time, GERD can make small holes (ulcers) in the lining of the esophagus. Follow these instructions at home: Diet  Follow a diet as told by your doctor. You may need to avoid foods and drinks such as: ? Coffee and tea (with or without caffeine). ? Drinks that contain alcohol. ? Energy drinks and sports drinks. ? Carbonated drinks or sodas. ? Chocolate and cocoa. ? Peppermint and mint flavorings. ? Garlic and onions. ? Horseradish. ? Spicy and acidic foods, such as peppers, chili powder, curry powder, vinegar, hot sauces, and BBQ sauce. ? Citrus fruit juices and citrus fruits, such as oranges, lemons, and limes. ? Tomato-based foods, such as red sauce, chili, salsa, and pizza with red sauce. ? Fried and fatty foods, such as donuts, french fries, potato chips, and high-fat dressings. ? High-fat meats, such as hot dogs, rib eye steak, sausage, ham, and bacon. ? High-fat dairy items, such as whole milk, butter, and cream cheese.  Eat small meals often. Avoid eating large meals.  Avoid drinking large amounts of liquid with your meals.  Avoid eating meals during the 2-3 hours before bedtime.  Avoid lying down right after you eat.  Do not exercise right after you eat. General instructions  Pay attention to any changes in your symptoms.  Take over-the-counter and prescription medicines only as told by your doctor. Do not take aspirin, ibuprofen, or other NSAIDs unless your doctor says it is okay.  Do not use any tobacco products, including cigarettes, chewing tobacco, and e-cigarettes. If you need help quitting, ask your doctor.  Wear loose clothes. Do not wear anything tight around  your waist.  Raise (elevate) the head of your bed about 6 inches (15 cm).  Try to lower your stress. If you need help doing this, ask your doctor.  If you are overweight, lose an amount of weight that is healthy for you. Ask your doctor about a safe weight loss goal.  Keep all follow-up visits as told by your doctor. This is important. Contact a doctor if:  You have new symptoms.  You lose weight and you do not know why it is happening.  You have trouble swallowing, or it hurts to swallow.  You have wheezing or a cough that keeps happening.  Your symptoms do not get better with treatment.  You have a hoarse voice. Get help right away if:  You have pain in your arms, neck, jaw, teeth, or back.  You feel sweaty, dizzy, or light-headed.  You have chest pain or shortness of breath.  You throw up (vomit) and your throw up looks like blood or coffee grounds.  You pass out (faint).  Your poop (stool) is bloody or black.  You cannot swallow, drink, or eat. This information is not intended to replace advice given to you by your health care provider. Make sure you discuss any questions you have with your health care provider. Document Released: 07/14/2007 Document Revised: 07/03/2015 Document Reviewed: 05/22/2014 Elsevier Interactive Patient Education  2018 [employer]  Food Choices for Gastroesophageal Reflux Disease, Adult When you have gastroesophageal reflux disease (GERD), the foods you eat and your eating habits are very important. Choosing the right foods can help ease your discomfort. What guidelines  do I need to follow?  Choose fruits, vegetables, whole grains, and low-fat dairy products.  Choose low-fat meat, fish, and poultry.  Limit fats such as oils, salad dressings, butter, nuts, and avocado.  Keep a food diary. This helps you identify foods that cause symptoms.  Avoid foods that cause symptoms. These may be different for everyone.  Eat small meals often  instead of 3 large meals a day.  Eat your meals slowly, in a place where you are relaxed.  Limit fried foods.  Cook foods using methods other than frying.  Avoid drinking alcohol.  Avoid drinking large amounts of liquids with your meals.  Avoid bending over or lying down until 2-3 hours after eating. What foods are not recommended? These are some foods and drinks that may make your symptoms worse: Vegetables Tomatoes. Tomato juice. Tomato and spaghetti sauce. Chili peppers. Onion and garlic. Horseradish. Fruits Oranges, grapefruit, and lemon (fruit and juice). Meats High-fat meats, fish, and poultry. This includes hot dogs, ribs, ham, sausage, salami, and bacon. Dairy Whole milk and chocolate milk. Sour cream. Cream. Butter. Ice cream. Cream cheese. Drinks Coffee and tea. Bubbly (carbonated) drinks or energy drinks. Condiments Hot sauce. Barbecue sauce. Sweets/Desserts Chocolate and cocoa. Donuts. Peppermint and spearmint. Fats and Oils High-fat foods. This includes [place] fries and potato chips. Other Vinegar. Strong spices. This includes black pepper, white pepper, red pepper, cayenne, curry powder, cloves, ginger, and chili powder. The items listed above may not be a complete list of foods and drinks to avoid. Contact your dietitian for more information. This information is not intended to replace advice given to you by your health care provider. Make sure you discuss any questions you have with your health care provider. Document Released: 07/27/2011 Document Revised: 07/03/2015 Document Reviewed: 11/29/2012 Elsevier Interactive Patient Education  2017 [employer] Inc[employer]  Gastroesophageal Reflux Disease, Adult Normally, food travels down the esophagus and stays in the stomach to be digested. If a person has gastroesophageal reflux disease (GERD), food and stomach acid move back up into the esophagus. When this happens, the esophagus becomes sore and swollen (inflamed). Over  time, GERD can make small holes (ulcers) in the lining of the esophagus. Follow these instructions at home: Diet  Follow a diet as told by your doctor. You may need to avoid foods and drinks such as: ? Coffee and tea (with or without caffeine). ? Drinks that contain alcohol. ? Energy drinks and sports drinks. ? Carbonated drinks or sodas. ? Chocolate and cocoa. ? Peppermint and mint flavorings. ? Garlic and onions. ? Horseradish. ? Spicy and acidic foods, such as peppers, chili powder, curry powder, vinegar, hot sauces, and BBQ sauce. ? Citrus fruit juices and citrus fruits, such as oranges, lemons, and limes. ? Tomato-based foods, such as red sauce, chili, salsa, and pizza with red sauce. ? Fried and fatty foods, such as donuts, french fries, potato chips, and high-fat dressings. ? High-fat meats, such as hot dogs, rib eye steak, sausage, ham, and bacon. ? High-fat dairy items, such as whole milk, butter, and cream cheese.  Eat small meals often. Avoid eating large meals.  Avoid drinking large amounts of liquid with your meals.  Avoid eating meals during the 2-3 hours before bedtime.  Avoid lying down right after you eat.  Do not exercise right after you eat. General instructions  Pay attention to any changes in your symptoms.  Take over-the-counter and prescription medicines only as told by your doctor. Do not take aspirin, ibuprofen, or  other NSAIDs unless your doctor says it is okay.  Do not use any tobacco products, including cigarettes, chewing tobacco, and e-cigarettes. If you need help quitting, ask your doctor.  Wear loose clothes. Do not wear anything tight around your waist.  Raise (elevate) the head of your bed about 6 inches (15 cm).  Try to lower your stress. If you need help doing this, ask your doctor.  If you are overweight, lose an amount of weight that is healthy for you. Ask your doctor about a safe weight loss goal.  Keep all follow-up visits as told  by your doctor. This is important. Contact a doctor if:  You have new symptoms.  You lose weight and you do not know why it is happening.  You have trouble swallowing, or it hurts to swallow.  You have wheezing or a cough that keeps happening.  Your symptoms do not get better with treatment.  You have a hoarse voice. Get help right away if:  You have pain in your arms, neck, jaw, teeth, or back.  You feel sweaty, dizzy, or light-headed.  You have chest pain or shortness of breath.  You throw up (vomit) and your throw up looks like blood or coffee grounds.  You pass out (faint).  Your poop (stool) is bloody or black.  You cannot swallow, drink, or eat. This information is not intended to replace advice given to you by your health care provider. Make sure you discuss any questions you have with your health care provider. Document Released: 07/14/2007 Document Revised: 07/03/2015 Document Reviewed: 05/22/2014 Elsevier Interactive Patient Education  2018 [employer] Inc. Irritable Bowel Syndrome, Adult Irritable bowel syndrome (IBS) is not one specific disease. It is a group of symptoms that affects the organs responsible for digestion (gastrointestinal or GI tract). To regulate how your GI tract works, your body sends signals back and forth between your intestines and your brain. If you have IBS, there may be a problem with these signals. As a result, your GI tract does not function normally. Your intestines may become more sensitive and overreact to certain things. This is especially true when you eat certain foods or when you are under stress. There are four types of IBS. These may be determined based on the consistency of your stool:  IBS with diarrhea.  IBS with constipation.  Mixed IBS.  Unsubtyped IBS.  It is important to know which type of IBS you have. Some treatments are more likely to be helpful for certain types of IBS. What are the causes? The exact cause of IBS  is not known. What increases the risk? You may have a higher risk of IBS if:  You are a woman.  You are younger than 51 years old.  You have a family history of IBS.  You have mental health problems.  You have had bacterial infection of your GI tract.  What are the signs or symptoms? Symptoms of IBS vary from person to person. The main symptom is abdominal pain or discomfort. Additional symptoms usually include one or more of the following:  Diarrhea, constipation, or both.  Abdominal swelling or bloating.  Feeling full or sick after eating a small or regular-size meal.  Frequent gas.  Mucus in the stool.  A feeling of having more stool left after a bowel movement.  Symptoms tend to come and go. They may be associated with stress, psychiatric conditions, or nothing at all. How is this diagnosed? There is no specific test to  diagnose IBS. Your health care provider will make a diagnosis based on a physical exam, medical history, and your symptoms. You may have other tests to rule out other conditions that may be causing your symptoms. These may include:  Blood tests.  X-rays.  CT scan.  Endoscopy and colonoscopy. This is a test in which your GI tract is viewed with a long, thin, flexible tube.  How is this treated? There is no cure for IBS, but treatment can help relieve symptoms. IBS treatment often includes:  Changes to your diet, such as: ? Eating more fiber. ? Avoiding foods that cause symptoms. ? Drinking more water. ? Eating regular, medium-sized portioned meals.  Medicines. These may include: ? Fiber supplements if you have constipation. ? Medicine to control diarrhea (antidiarrheal medicines). ? Medicine to help control muscle spasms in your GI tract (antispasmodic medicines). ? Medicines to help with any mental health issues, such as antidepressants or tranquilizers.  Therapy. ? Talk therapy may help with anxiety, depression, or other mental health  issues that can make IBS symptoms worse.  Stress reduction. ? Managing your stress can help keep symptoms under control.  Follow these instructions at home:  Take medicines only as directed by your health care provider.  Eat a healthy diet. ? Avoid foods and drinks with added sugar. ? Include more whole grains, fruits, and vegetables gradually into your diet. This may be especially helpful if you have IBS with constipation. ? Avoid any foods and drinks that make your symptoms worse. These may include dairy products and caffeinated or carbonated drinks. ? Do not eat large meals. ? Drink enough fluid to keep your urine clear or pale yellow.  Exercise regularly. Ask your health care provider for recommendations of good activities for you.  Keep all follow-up visits as directed by your health care provider. This is important. Contact a health care provider if:  You have constant pain.  You have trouble or pain with swallowing.  You have worsening diarrhea. Get help right away if:  You have severe and worsening abdominal pain.  You have diarrhea and: ? You have a rash, stiff neck, or severe headache. ? You are irritable, sleepy, or difficult to awaken. ? You are weak, dizzy, or extremely thirsty.  You have bright red blood in your stool or you have black tarry stools.  You have unusual abdominal swelling that is painful.  You vomit continuously.  You vomit blood (hematemesis).  You have both abdominal pain and a fever. This information is not intended to replace advice given to you by your health care provider. Make sure you discuss any questions you have with your health care provider. Document Released: 01/25/2005 Document Revised: 06/27/2015 Document Reviewed: 10/12/2013 Elsevier Interactive Patient Education  2018 [employer].

## 2017-02-02 NOTE — Progress Notes (Addendum)
   Subjective:    Patient ID: Tiffany ClementCindy M Stout, female    DOB: 12/20/1965, 51 y.o.   MRN: [id number]  HPI Referred by Dr. Sherril CroonVyas for GERD.  She says she has been having uncontrolled diarrhea. She says she has had diarrhea for years.  She was started on Viberzi. She says the Viberzi has helped. She says she has IBS for years.  She is having on average  She also c/o acid reflux. She says she has a burning sensation in her esophagus and nausea. She also taking Protonix 40mg  BID. She says since bumping up the Protonix to twice a day has helped some. She may have 3-8 BMs a day.  Her appetite is not good. No weight loss. She thinks she has actually gained some weight.  She avoids caffeine, fatty foods, and spicy foods.  No abdominal pain.   04/23/2016 H. Pylori negative 05/03/2016 H and H 13.1 and 39.6    Underwent an EGD in 2013 which revealed :  Impression: Small sliding hiatal hernia along with mild changes of reflux esophagitis limited to GE junction. Nonerosive antral gastritis.  Review of Systems      Past Medical History:  Diagnosis Date  . GERD (gastroesophageal reflux disease)     Past Surgical History:  Procedure Laterality Date  . ABDOMINAL HYSTERECTOMY    . CHOLECYSTECTOMY    . ESOPHAGOGASTRODUODENOSCOPY  11/11/2011   Procedure: ESOPHAGOGASTRODUODENOSCOPY (EGD);  Surgeon: Malissa HippoNajeeb U Rehman, MD;  Location: [facility];  Service: Endoscopy;  Laterality: N/A;  315    No Known Allergies  Current Outpatient Medications on File Prior to Visit  Medication Sig Dispense Refill  . Eluxadoline (VIBERZI) 100 MG TABS Take by mouth.    Marland Kitchen. FLUoxetine (PROZAC) 20 MG tablet Take 20 mg by mouth daily.    . pantoprazole (PROTONIX) 40 MG tablet Take 40 mg by mouth daily.     No current facility-administered medications on file prior to visit.     gerd Objective:   Physical Exam Blood pressure 130/84, pulse 60, temperature 97.9 F (36.6 C), weight 140 lb 9.6 oz (63.8 kg).   Alert and  oriented. Skin warm and dry. Oral mucosa is moist.   . Sclera anicteric, conjunctivae is pink. Thyroid not enlarged. No cervical lymphadenopathy. Lungs clear. Heart regular rate and rhythm.  Abdomen is soft. Bowel sounds are positive. No hepatomegaly. No abdominal masses felt. No tenderness.  No edema to lower extremities. Patient is alert and oriented.       Assessment & Plan:  GERD. Am going to keep her on Protonix 40mg  BID. Diarrhea: am going to start her on Dicyclomine 10mg  QID.  OV in 3 months GERD diet given to patient. IBS instructions given to patient

## 2017-02-04 ENCOUNTER — Telehealth (INDEPENDENT_AMBULATORY_CARE_PROVIDER_SITE_OTHER): Payer: Self-pay | Admitting: Internal Medicine

## 2017-02-04 NOTE — Telephone Encounter (Signed)
I talked with husband.

## 2017-02-04 NOTE — Telephone Encounter (Signed)
Patient called, wants to know if she is supposed to take the reflux medication and the new medication together.  She will be at work, so leave her a message per the patient.  [phone]

## 2017-05-03 ENCOUNTER — Ambulatory Visit (INDEPENDENT_AMBULATORY_CARE_PROVIDER_SITE_OTHER): Payer: 59 | Admitting: Internal Medicine

## 2017-05-23 ENCOUNTER — Encounter (INDEPENDENT_AMBULATORY_CARE_PROVIDER_SITE_OTHER): Payer: Self-pay | Admitting: Internal Medicine

## 2017-05-23 ENCOUNTER — Ambulatory Visit (INDEPENDENT_AMBULATORY_CARE_PROVIDER_SITE_OTHER): Payer: 59 | Admitting: Internal Medicine

## 2017-05-23 VITALS — BP 152/90 | HR 76 | Temp 97.5°F | Ht 64.0 in | Wt 148.3 lb

## 2017-05-23 DIAGNOSIS — K580 Irritable bowel syndrome with diarrhea: Secondary | ICD-10-CM

## 2017-05-23 DIAGNOSIS — K219 Gastro-esophageal reflux disease without esophagitis: Secondary | ICD-10-CM

## 2017-05-23 DIAGNOSIS — K589 Irritable bowel syndrome without diarrhea: Secondary | ICD-10-CM | POA: Insufficient documentation

## 2017-05-23 NOTE — Progress Notes (Signed)
   Subjective:    Patient ID: Tiffany Stout, female    DOB: Feb 03, 1966, 52 y.o.   MRN: [id number]  HPI Here today for f/u. Last seen in December of 2018.  Hx of chronic GERD. Hx of IBS diarrhea. She is maintained on Dicyclomine and Viberzi. She tells me she is doing well. Her GERD is controlled with Protonix. She is having a BM 3-6 times a day. Her stools are formed. On average, she has 3-4 stools a day. Her appetite is good. No weight loss.      Underwent an EGD in 2013 which revealed :  Impression: Small sliding hiatal hernia along with mild changes of reflux esophagitis limited to GE junction. Nonerosive antral gastritis.   Review of Systems Past Medical History:  Diagnosis Date  . GERD (gastroesophageal reflux disease)   . IBS (irritable bowel syndrome)     Past Surgical History:  Procedure Laterality Date  . ABDOMINAL HYSTERECTOMY    . CHOLECYSTECTOMY    . ESOPHAGOGASTRODUODENOSCOPY  11/11/2011   Procedure: ESOPHAGOGASTRODUODENOSCOPY (EGD);  Surgeon: Malissa HippoNajeeb U Rehman, MD;  Location: [facility];  Service: Endoscopy;  Laterality: N/A;  315    No Known Allergies  Current Outpatient Medications on File Prior to Visit  Medication Sig Dispense Refill  . dicyclomine (BENTYL) 10 MG capsule Take 1 capsule (10 mg total) by mouth 4 (four) times daily -  before meals and at bedtime. 120 capsule 3  . Eluxadoline (VIBERZI) 100 MG TABS Take by mouth.    Marland Kitchen. FLUoxetine (PROZAC) 20 MG tablet Take 20 mg by mouth daily.    . pantoprazole (PROTONIX) 40 MG tablet Take 40 mg by mouth daily.     No current facility-administered medications on file prior to visit.         Objective:   Physical Exam Blood pressure (!) 152/90, pulse 76, temperature (!) 97.5 F (36.4 C), height 5\' 4"  (1.626 m), weight 148 lb 4.8 oz (67.3 kg). Alert and oriented. Skin warm and dry. Oral mucosa is moist.   . Sclera anicteric, conjunctivae is pink. Thyroid not enlarged. No cervical lymphadenopathy. Lungs  clear. Heart regular rate and rhythm.  Abdomen is soft. Bowel sounds are positive. No hepatomegaly. No abdominal masses felt. No tenderness.  No edema to lower extremities           Assessment & Plan:  GERD. Continue the Protonix. IBS: Continue to Dicyclomine and Vibrezi. OV in 1 year.  She will locate her last colonoscopy report.

## 2017-05-23 NOTE — Patient Instructions (Signed)
OV in 1 year.  

## 2018-06-06 ENCOUNTER — Ambulatory Visit (INDEPENDENT_AMBULATORY_CARE_PROVIDER_SITE_OTHER): Payer: 59 | Admitting: Internal Medicine

## 2018-09-12 ENCOUNTER — Other Ambulatory Visit: Payer: Self-pay

## 2018-09-12 DIAGNOSIS — Z20822 Contact with and (suspected) exposure to covid-19: Secondary | ICD-10-CM

## 2018-09-13 LAB — NOVEL CORONAVIRUS, NAA: SARS-CoV-2, NAA: DETECTED — AB

## 2018-09-21 ENCOUNTER — Other Ambulatory Visit: Payer: Self-pay | Admitting: Internal Medicine

## 2018-09-21 DIAGNOSIS — Z20822 Contact with and (suspected) exposure to covid-19: Secondary | ICD-10-CM

## 2018-09-23 LAB — NOVEL CORONAVIRUS, NAA: SARS-CoV-2, NAA: DETECTED — AB

## 2018-10-24 ENCOUNTER — Other Ambulatory Visit: Payer: Self-pay

## 2018-10-24 DIAGNOSIS — Z20822 Contact with and (suspected) exposure to covid-19: Secondary | ICD-10-CM

## 2018-10-26 LAB — NOVEL CORONAVIRUS, NAA: SARS-CoV-2, NAA: NOT DETECTED

## 2018-11-30 ENCOUNTER — Other Ambulatory Visit: Payer: Self-pay | Admitting: *Deleted

## 2018-11-30 DIAGNOSIS — Z20822 Contact with and (suspected) exposure to covid-19: Secondary | ICD-10-CM

## 2018-12-03 LAB — NOVEL CORONAVIRUS, NAA: SARS-CoV-2, NAA: NOT DETECTED

## 2019-03-26 ENCOUNTER — Telehealth: Payer: Self-pay | Admitting: Orthopedic Surgery

## 2019-03-26 NOTE — Telephone Encounter (Signed)
Patient called to relay she was advised by her primary care, Dr Vyas/[facility] that she is to see an orthopaedist. Also mentioned having been given cortisone injections by a Dr in [place], but "not sure if this doctor is an orthopaedic."  Mentioned Dr Byrd Hesselbach. Also said her Dr was trying to get an MRI done on this knee.  Our connection on phone was very poor; patient was given general information/protocol regarding second opinion requiring notes/films/reports to be reviewed. Or she may request a referral. States will call back and maybe she'll be able to have a better connection and hear [place] better.

## 2019-04-09 ENCOUNTER — Ambulatory Visit (INDEPENDENT_AMBULATORY_CARE_PROVIDER_SITE_OTHER): Payer: No Typology Code available for payment source

## 2019-04-09 ENCOUNTER — Ambulatory Visit (INDEPENDENT_AMBULATORY_CARE_PROVIDER_SITE_OTHER): Payer: No Typology Code available for payment source | Admitting: Orthopaedic Surgery

## 2019-04-09 ENCOUNTER — Other Ambulatory Visit: Payer: Self-pay

## 2019-04-09 ENCOUNTER — Ambulatory Visit: Payer: Self-pay

## 2019-04-09 DIAGNOSIS — M25561 Pain in right knee: Secondary | ICD-10-CM

## 2019-04-09 DIAGNOSIS — M25562 Pain in left knee: Secondary | ICD-10-CM

## 2019-04-09 DIAGNOSIS — M1711 Unilateral primary osteoarthritis, right knee: Secondary | ICD-10-CM

## 2019-04-09 DIAGNOSIS — M1712 Unilateral primary osteoarthritis, left knee: Secondary | ICD-10-CM | POA: Diagnosis not present

## 2019-04-09 MED ORDER — NABUMETONE 500 MG PO TABS: 500.0000 mg | ORAL_TABLET | Freq: Two times a day (BID) | ORAL | 1 refills | Status: DC | PRN

## 2019-04-09 NOTE — Progress Notes (Signed)
Office Visit Note   Patient: Tiffany Stout           Date of Birth: 16-Oct-1965           MRN: [id number] Visit Date: 04/09/2019              Requested by: Tiffany Chroman, MD [place],  [place] [address] PCP: Tiffany Chroman, MD   Assessment & Plan: Visit Diagnoses:  1. Left knee pain, unspecified chronicity   2. Right knee pain, unspecified chronicity   3. Unilateral primary osteoarthritis, left knee   4. Unilateral primary osteoarthritis, right knee     Plan: I showed the patient her x-rays in detail and went over knee model.  At this point she has tried and failed all forms of conservative treatment.  Injections will not help anymore given the fact that she does have bone-on-bone wear of both knees at the medial compartment and the patellofemoral joint.  My recommendation at this standpoint would be activity modification with quad strengthening exercises and trying an anti-inflammatory such as Relafen.  The other option would be knee replacement surgery.  I showed her knee replacement model and explained in significant detail what this involves.  This would be quite a painful surgery for someone who does have fibromyalgia and is only 54 years old.  She would certainly have a difficult time recovering from the surgery due to the painful aspects of it but at some point given the severity of arthritis in her pain she would likely need to proceed with this type of surgery.  She understands this is elective and quality of life surgery.  However, given the fact that she does have severe arthritis and this is causing such problems with her mobility and quality of life I would recommend knee replacement surgery.  We will try Relafen first.  All questions and concerns were answered and addressed.  I like see her back in just 4 weeks to see how she is doing overall.  Follow-Up Instructions: Return in about 4 weeks (around 05/07/2019).   Orders:  Orders Placed This Encounter  Procedures  . XR  Knee 1-2 Views Left  . XR Knee 1-2 Views Right   Meds ordered this encounter  Medications  . nabumetone (RELAFEN) 500 MG tablet    Sig: Take 1 tablet (500 mg total) by mouth 2 (two) times daily as needed.    Dispense:  60 tablet    Refill:  1      Procedures: No procedures performed   Clinical Data: No additional findings.   Subjective: Chief Complaint  Patient presents with  . Left Knee - Pain  . Right Knee - Pain  The patient is a very pleasant 54 year old referred from Tiffany Stout nurse practitioner to evaluate bilateral knee pain that is quite severe.  The patient is starting developed slight flexion contractures due to the severity of her pain.  This has been going on for many years now.  It does wake her up at night and has gotten to be quite severe she states.  She does have a history of fibromyalgia as well.  She denies much swelling.  She has had multiple injections in her knees and that does not help at this point.  Now her knee pain is become 10 out of 10 at times.  It has detrimentally affected her quality of life, her mobility and her actives daily living.  She wants to see what other treatment options  there are at this point.  HPI  Review of Systems She currently denies any headache, chest pain, shortness of breath, fever, chills, nausea, vomiting  Objective: Vital Signs: There were no vitals taken for this visit.  Physical Exam She is alert and orient x3 and in no acute distress Ortho Exam Examination of both knees shows severe pain with flexion and extension.  Both knees have just slight effusion.  Both knees lack full extension bilaterally 3 degrees.  Both knees are ligamentously stable with varus malalignment that is correctable.  Both knees have global joint line tenderness medially and laterally as well as the patellofemoral joint with patellofemoral crepitation as well on range of motion. Specialty Comments:  No specialty comments  available.  Imaging: XR Knee 1-2 Views Left  Result Date: 04/09/2019 2 views of the left knee show varus malalignment with tricompartment arthritic changes.  There is severe medial joint space narrowing and patellofemoral disease.  XR Knee 1-2 Views Right  Result Date: 04/09/2019 2 views of the right knee severe tricompartment arthritic changes with varus malalignment, bone-on-bone wear of the medial compartment with complete medial compartment joint space loss and severe patellofemoral arthritic changes.    PMFS History: Patient Active Problem List   Diagnosis Date Noted  . Unilateral primary osteoarthritis, left knee 04/09/2019  . Unilateral primary osteoarthritis, right knee 04/09/2019  . IBS (irritable bowel syndrome)   . GERD (gastroesophageal reflux disease) 11/08/2011   Past Medical History:  Diagnosis Date  . GERD (gastroesophageal reflux disease)   . IBS (irritable bowel syndrome)     No family history on file.  Past Surgical History:  Procedure Laterality Date  . ABDOMINAL HYSTERECTOMY    . CHOLECYSTECTOMY    . ESOPHAGOGASTRODUODENOSCOPY  11/11/2011   Procedure: ESOPHAGOGASTRODUODENOSCOPY (EGD);  Surgeon: Malissa Hippo, MD;  Location: [facility];  Service: Endoscopy;  Laterality: N/A;  315   Social History   Occupational History  . Not on file  Tobacco Use  . Smoking status: Never Smoker  . Smokeless tobacco: Never Used  Substance and Sexual Activity  . Alcohol use: No  . Drug use: No  . Sexual activity: Not on file

## 2019-05-03 ENCOUNTER — Ambulatory Visit: Payer: No Typology Code available for payment source | Attending: Internal Medicine

## 2019-05-03 DIAGNOSIS — Z23 Encounter for immunization: Secondary | ICD-10-CM

## 2019-05-03 NOTE — Progress Notes (Signed)
   Covid-19 Vaccination Clinic  Name:  Tiffany Stout    MRN: [id number] DOB: October 22, 1965  05/03/2019  [name] was observed post Covid-19 immunization for 15 minutes without incident. She was provided with Vaccine Information Sheet and instruction to access the V-Safe system.   [name] was instructed to call 911 with any severe reactions post vaccine: Marland Kitchen Difficulty breathing  . Swelling of face and throat  . A fast heartbeat  . A bad rash all over body  . Dizziness and weakness   Immunizations Administered    Name Date Dose VIS Date Route   Moderna COVID-19 Vaccine 05/03/2019  8:18 AM 0.5 mL 01/09/2019 Intramuscular   Manufacturer: Moderna   Lot: [id number]   NDC: 39688-648-47

## 2019-05-14 ENCOUNTER — Encounter: Payer: Self-pay | Admitting: Orthopaedic Surgery

## 2019-05-14 ENCOUNTER — Ambulatory Visit (INDEPENDENT_AMBULATORY_CARE_PROVIDER_SITE_OTHER): Payer: No Typology Code available for payment source | Admitting: Orthopaedic Surgery

## 2019-05-14 ENCOUNTER — Other Ambulatory Visit: Payer: Self-pay

## 2019-05-14 DIAGNOSIS — M1712 Unilateral primary osteoarthritis, left knee: Secondary | ICD-10-CM

## 2019-05-14 DIAGNOSIS — M1711 Unilateral primary osteoarthritis, right knee: Secondary | ICD-10-CM

## 2019-05-14 DIAGNOSIS — M25562 Pain in left knee: Secondary | ICD-10-CM | POA: Diagnosis not present

## 2019-05-14 DIAGNOSIS — M25561 Pain in right knee: Secondary | ICD-10-CM | POA: Diagnosis not present

## 2019-05-14 NOTE — Progress Notes (Signed)
The patient is well-known to me.  She has severe end-stage arthritis of both her knees.  She is only 54 years old.  She has been dealing with knee pain for well over a year now.  It is become significantly debilitating for her.  She has tried and failed all forms of conservative treatment including activity modification, quad strengthening exercises, rest, time, ice, heat, anti-inflammatories and multiple injections.  Her x-rays at the last visit confirm severe arthritis of both knees with bone-on-bone wear.  They both hurt her on a daily basis.  She is on her feet all day long and works long hours walking quite a bit.  We have tried Relafen also recently as an anti-inflammatory.  At this point the pain is so debilitating that she does wish to proceed with knee replacement surgery.  Her knee pain is detrimentally affecting her mobility, her quality of life and her actives daily living.  Her daughter is with her today so I went over knee model in detail showing her what knee replacement surgery involves.  We reviewed the x-rays as well.  I agree that this is the next step for her given the failed conservative treatment for over a year.  I had a long and thorough discussion about knee replacement surgery.  I showed her a knee replacement model and described in detail what the surgery involves.  We talked about her interoperative and postoperative course.  I described in detail the risk and benefits of surgery and what to expect from a therapy standpoint.  She understands the given her fibromyalgia and her young age this will be quite a painful experience and will need to push her through the pain to maximize her therapy to get the knee bending and moving.  She is interested in having the surgery performed at this standpoint I agree.  We will work on getting things scheduled for her.  All questions and concerns were answered and addressed.

## 2019-05-24 ENCOUNTER — Telehealth: Payer: Self-pay | Admitting: Orthopaedic Surgery

## 2019-05-24 NOTE — Telephone Encounter (Signed)
Received vm from patient about fmla forms & $25.00 dropped off last Friday. She stated [facility] didn't have them. IC,lmvm for her to [facility]mc. (we do have forms&$$ and sent to [facility]). Daughter also lm, but is not on HIPAA

## 2019-05-30 ENCOUNTER — Other Ambulatory Visit: Payer: Self-pay | Admitting: Physician Assistant

## 2019-05-31 ENCOUNTER — Telehealth: Payer: Self-pay

## 2019-05-31 NOTE — Telephone Encounter (Signed)
err

## 2019-05-31 NOTE — Progress Notes (Signed)
[facility] - [place], [place] - [address] Jerolyn Shin [place] [address] Phone: [phone] Fax: [phone]      Your procedure is scheduled on Tuesday, June 05, 2019.  Report to [facility] Main Entrance "A" at 10:00 A.M., and check in at the Admitting office.  Call this number if you have problems the morning of surgery:  [phone]  Call [phone] if you have any questions prior to your surgery date Monday-Friday 8am-4pm    Remember:  Do not eat after midnight the night before your surgery  You may drink clear liquids until 9:00 AM the morning of your surgery.   Clear liquids allowed are: Water, Non-Citrus Juices (without pulp), Carbonated Beverages, Clear Tea, Black Coffee Only, and Gatorade   Enhanced Recovery after Surgery for Orthopedics Enhanced Recovery after Surgery is a protocol used to improve the stress on your body and your recovery after surgery.  Patient Instructions  . The night before surgery:  o No food after midnight. ONLY clear liquids after midnight  .  Marland Kitchen The day of surgery (if you do NOT have diabetes):  o Drink ONE (1) Pre-Surgery Clear Ensure as directed.   o This drink was given to you during your hospital  pre-op appointment visit. o The pre-op nurse will instruct you on the time to drink the  Pre-Surgery Ensure depending on your surgery time. o Finish the drink by 9:00 AM.  o Nothing else to drink after completing the  Pre-Surgery Clear Ensure.      Take these medicines the morning of surgery with A SIP OF WATER:  FLUoxetine (PROZAC)   As of today, STOP taking any Aspirin (unless otherwise instructed by your surgeon) and Aspirin containing products, Aleve, Naproxen, Ibuprofen, Motrin, Advil, Goody's, BC's, all herbal medications, fish oil, and all vitamins.                      Do not wear jewelry, make up, or nail polish            Do not wear lotions, powders, perfumes, or deodorant.            Do not  shave 48 hours prior to surgery.            Do not bring valuables to the hospital.            [facility] is not responsible for any belongings or valuables.  Do NOT Smoke (Tobacco/Vapping) or drink Alcohol 24 hours prior to your procedure If you use a CPAP at night, you may bring all equipment for your overnight stay.   Contacts, glasses, dentures or bridgework may not be worn into surgery.      For patients admitted to the hospital, discharge time will be determined by your treatment team.   Patients discharged the day of surgery will not be allowed to drive home, and someone needs to stay with them for 24 hours.    Special instructions:   [place]- Preparing For Surgery  Before surgery, you can play an important role. Because skin is not sterile, your skin needs to be as free of germs as possible. You can reduce the number of germs on your skin by washing with CHG (chlorahexidine gluconate) Soap before surgery.  CHG is an antiseptic cleaner which kills germs and bonds with the skin to continue killing germs even after washing.    Oral Hygiene is also important to reduce  your risk of infection.  Remember - BRUSH YOUR TEETH THE MORNING OF SURGERY WITH YOUR REGULAR TOOTHPASTE  Please do not use if you have an allergy to CHG or antibacterial soaps. If your skin becomes reddened/irritated stop using the CHG.  Do not shave (including legs and underarms) for at least 48 hours prior to first CHG shower. It is OK to shave your face.  Please follow these instructions carefully.   1. Shower the NIGHT BEFORE SURGERY and the MORNING OF SURGERY with CHG Soap.   2. If you chose to wash your hair, wash your hair first as usual with your normal shampoo.  3. After you shampoo, rinse your hair and body thoroughly to remove the shampoo.  4. Use CHG as you would any other liquid soap. You can apply CHG directly to the skin and wash gently with a scrungie or a clean washcloth.   5. Apply the CHG  Soap to your body ONLY FROM THE NECK DOWN.  Do not use on open wounds or open sores. Avoid contact with your eyes, ears, mouth and genitals (private parts). Wash Face and genitals (private parts)  with your normal soap.   6. Wash thoroughly, paying special attention to the area where your surgery will be performed.  7. Thoroughly rinse your body with warm water from the neck down.  8. DO NOT shower/wash with your normal soap after using and rinsing off the CHG Soap.  9. Pat yourself dry with a CLEAN TOWEL.  10. Wear CLEAN PAJAMAS to bed the night before surgery, wear comfortable clothes the morning of surgery  11. Place CLEAN SHEETS on your bed the night of your first shower and DO NOT SLEEP WITH PETS.   Day of Surgery:   Do not apply any deodorants/lotions.  Please wear clean clothes to the hospital/surgery center.   Remember to brush your teeth WITH YOUR REGULAR TOOTHPASTE.   Please read over the following fact sheets that you were given.

## 2019-06-01 ENCOUNTER — Other Ambulatory Visit: Payer: Self-pay

## 2019-06-01 ENCOUNTER — Other Ambulatory Visit (HOSPITAL_COMMUNITY)
Admission: RE | Admit: 2019-06-01 | Discharge: 2019-06-01 | Disposition: A | Discharge status: Home / Self Care | Payer: PRIVATE HEALTH INSURANCE | Source: Ambulatory Visit | Attending: Orthopaedic Surgery | Admitting: Orthopaedic Surgery

## 2019-06-01 ENCOUNTER — Encounter (HOSPITAL_COMMUNITY): Payer: Self-pay

## 2019-06-01 ENCOUNTER — Encounter (HOSPITAL_COMMUNITY)
Admission: RE | Admit: 2019-06-01 | Discharge: 2019-06-01 | Disposition: A | Discharge status: Home / Self Care | Payer: PRIVATE HEALTH INSURANCE | Source: Ambulatory Visit | Attending: Orthopaedic Surgery | Admitting: Orthopaedic Surgery

## 2019-06-01 DIAGNOSIS — Z20822 Contact with and (suspected) exposure to covid-19: Secondary | ICD-10-CM | POA: Diagnosis not present

## 2019-06-01 DIAGNOSIS — Z01818 Encounter for other preprocedural examination: Secondary | ICD-10-CM | POA: Diagnosis present

## 2019-06-01 HISTORY — DX: Fibromyalgia: M79.7

## 2019-06-01 HISTORY — DX: Unspecified osteoarthritis, unspecified site: M19.90

## 2019-06-01 HISTORY — DX: Essential (primary) hypertension: I10

## 2019-06-01 HISTORY — DX: Anxiety disorder, unspecified: F41.9

## 2019-06-01 LAB — BASIC METABOLIC PANEL
Anion gap: 7 (ref 5–15)
BUN: 12 mg/dL (ref 6–20)
CO2: 26 mmol/L (ref 22–32)
Calcium: 9.3 mg/dL (ref 8.9–10.3)
Chloride: 105 mmol/L (ref 98–111)
Creatinine, Ser: 0.5 mg/dL (ref 0.44–1.00)
GFR calc Af Amer: >60 mL/min (ref >60)
GFR calc non Af Amer: >60 mL/min (ref >60)
Glucose, Bld: 96 mg/dL (ref 70–99)
Potassium: 3.7 mmol/L (ref 3.5–5.1)
Sodium: 138 mmol/L (ref 135–145)

## 2019-06-01 LAB — CBC
HCT: 44.3 % (ref 36.0–46.0)
Hemoglobin: 14.1 g/dL (ref 12.0–15.0)
MCH: 31.1 pg (ref 26.0–34.0)
MCHC: 31.8 g/dL (ref 30.0–36.0)
MCV: 97.6 fL (ref 80.0–100.0)
Platelets: 236 10*3/uL (ref 150–400)
RBC: 4.54 MIL/uL (ref 3.87–5.11)
RDW: 12.8 % (ref 11.5–15.5)
WBC: 6 10*3/uL (ref 4.0–10.5)
nRBC: 0 % (ref 0.0–0.2)

## 2019-06-01 LAB — SURGICAL PCR SCREEN
MRSA, PCR: NEGATIVE
Staphylococcus aureus: NEGATIVE

## 2019-06-01 NOTE — Progress Notes (Signed)
PCP - Dr. Doreen Beam Cardiologist - Denies  PPM/ICD - Denies  Chest x-ray - N/A EKG - 06/01/19 Stress Test - Denies ECHO - Denies Cardiac Cath - Denies  Sleep Study - Denies  Pt denies being diabetic.  Blood Thinner Instructions: N/A Aspirin Instructions: N/A  ERAS Protcol - Yes, PRE-SURGERY Ensure  COVID TEST- 06/01/19   Coronavirus Screening  Have you experienced the following symptoms:  Cough yes/no: No Fever (>100.70F)  yes/no: No Runny nose yes/no: No Sore throat yes/no: No Difficulty breathing/shortness of breath  yes/no: No  Have you or a family member traveled in the last 14 days and where? yes/no: No   If the patient indicates "YES" to the above questions, their PAT will be rescheduled to limit the exposure to others and, the surgeon will be notified. THE PATIENT WILL NEED TO BE ASYMPTOMATIC FOR 14 DAYS.   If the patient is not experiencing any of these symptoms, the PAT nurse will instruct them to NOT bring anyone with them to their appointment since they may have these symptoms or traveled as well.   Please remind your patients and families that hospital visitation restrictions are in effect and the importance of the restrictions.     Anesthesia review: No  Patient denies shortness of breath, fever, cough and chest pain at PAT appointment   All instructions explained to the patient, with a verbal understanding of the material. Patient agrees to go over the instructions while at home for a better understanding. Patient also instructed to self quarantine after being tested for COVID-19. The opportunity to ask questions was provided.

## 2019-06-02 LAB — SARS CORONAVIRUS 2 (TAT 6-24 HRS): SARS Coronavirus 2: NEGATIVE

## 2019-06-04 ENCOUNTER — Other Ambulatory Visit: Payer: Self-pay

## 2019-06-05 ENCOUNTER — Ambulatory Visit (HOSPITAL_COMMUNITY): Payer: PRIVATE HEALTH INSURANCE | Admitting: Certified Registered"

## 2019-06-05 ENCOUNTER — Observation Stay (HOSPITAL_COMMUNITY): Payer: PRIVATE HEALTH INSURANCE

## 2019-06-05 ENCOUNTER — Encounter (HOSPITAL_COMMUNITY)
Admission: RE | Disposition: A | Discharge status: Home Health | Payer: Self-pay | Source: Home / Self Care | Attending: Orthopaedic Surgery

## 2019-06-05 ENCOUNTER — Encounter (HOSPITAL_COMMUNITY): Payer: Self-pay | Admitting: Orthopaedic Surgery

## 2019-06-05 ENCOUNTER — Ambulatory Visit: Payer: No Typology Code available for payment source

## 2019-06-05 ENCOUNTER — Other Ambulatory Visit: Payer: Self-pay

## 2019-06-05 ENCOUNTER — Observation Stay (HOSPITAL_COMMUNITY)
Admission: RE | Admit: 2019-06-05 | Discharge: 2019-06-07 | Disposition: A | Discharge status: Home Health | Payer: PRIVATE HEALTH INSURANCE | Attending: Orthopaedic Surgery | Admitting: Orthopaedic Surgery

## 2019-06-05 DIAGNOSIS — I10 Essential (primary) hypertension: Secondary | ICD-10-CM | POA: Diagnosis not present

## 2019-06-05 DIAGNOSIS — M797 Fibromyalgia: Secondary | ICD-10-CM | POA: Diagnosis not present

## 2019-06-05 DIAGNOSIS — G8918 Other acute postprocedural pain: Secondary | ICD-10-CM | POA: Diagnosis not present

## 2019-06-05 DIAGNOSIS — M1712 Unilateral primary osteoarthritis, left knee: Secondary | ICD-10-CM | POA: Diagnosis not present

## 2019-06-05 DIAGNOSIS — Z87891 Personal history of nicotine dependence: Secondary | ICD-10-CM | POA: Diagnosis not present

## 2019-06-05 DIAGNOSIS — M170 Bilateral primary osteoarthritis of knee: Secondary | ICD-10-CM | POA: Diagnosis not present

## 2019-06-05 DIAGNOSIS — M79669 Pain in unspecified lower leg: Secondary | ICD-10-CM | POA: Insufficient documentation

## 2019-06-05 DIAGNOSIS — Z96652 Presence of left artificial knee joint: Secondary | ICD-10-CM

## 2019-06-05 HISTORY — PX: TOTAL KNEE ARTHROPLASTY: SHX125

## 2019-06-05 SURGERY — ARTHROPLASTY, KNEE, TOTAL
Anesthesia: General | Site: Knee | Laterality: Left

## 2019-06-05 MED ORDER — FENTANYL CITRATE (PF) 250 MCG/5ML IJ SOLN
INTRAMUSCULAR | Status: AC
  Filled 2019-06-05: qty 5

## 2019-06-05 MED ORDER — ALUM & MAG HYDROXIDE-SIMETH 200-200-20 MG/5ML PO SUSP: 30.0000 mL | ORAL | Status: DC | PRN

## 2019-06-05 MED ORDER — FLUOXETINE HCL 20 MG PO CAPS
40.0000 mg | ORAL_CAPSULE | Freq: Every day | ORAL | Status: DC
  Administered 2019-06-06 – 2019-06-07 (×2): 40 mg via ORAL
  Filled 2019-06-05 (×2): qty 2

## 2019-06-05 MED ORDER — DEXAMETHASONE SODIUM PHOSPHATE 10 MG/ML IJ SOLN
INTRAMUSCULAR | Status: AC
  Filled 2019-06-05: qty 1

## 2019-06-05 MED ORDER — METOCLOPRAMIDE HCL 5 MG/ML IJ SOLN: 5.0000 mg | Freq: Three times a day (TID) | INTRAMUSCULAR | Status: DC | PRN

## 2019-06-05 MED ORDER — FENTANYL CITRATE (PF) 100 MCG/2ML IJ SOLN: 50.0000 ug | Freq: Once | INTRAMUSCULAR | Status: AC

## 2019-06-05 MED ORDER — ASPIRIN EC 325 MG PO TBEC
325.0000 mg | DELAYED_RELEASE_TABLET | Freq: Two times a day (BID) | ORAL | Status: DC
  Administered 2019-06-05 – 2019-06-07 (×4): 325 mg via ORAL
  Filled 2019-06-05 (×4): qty 1

## 2019-06-05 MED ORDER — CEFAZOLIN SODIUM-DEXTROSE 2-4 GM/100ML-% IV SOLN
INTRAVENOUS | Status: AC
  Filled 2019-06-05: qty 100

## 2019-06-05 MED ORDER — METOCLOPRAMIDE HCL 5 MG PO TABS: 5.0000 mg | ORAL_TABLET | Freq: Three times a day (TID) | ORAL | Status: DC | PRN

## 2019-06-05 MED ORDER — CEFAZOLIN SODIUM-DEXTROSE 2-4 GM/100ML-% IV SOLN
2.0000 g | INTRAVENOUS | Status: AC
  Administered 2019-06-05: 13:00:00 2 g via INTRAVENOUS

## 2019-06-05 MED ORDER — LACTATED RINGERS IV SOLN: INTRAVENOUS | Status: DC | PRN

## 2019-06-05 MED ORDER — DEXAMETHASONE SODIUM PHOSPHATE 10 MG/ML IJ SOLN
INTRAMUSCULAR | Status: DC | PRN
  Administered 2019-06-05: 5 mg via INTRAVENOUS

## 2019-06-05 MED ORDER — MIDAZOLAM HCL 2 MG/2ML IJ SOLN
INTRAMUSCULAR | Status: AC
  Administered 2019-06-05: 2 mg via INTRAVENOUS
  Filled 2019-06-05: qty 2

## 2019-06-05 MED ORDER — ONDANSETRON HCL 4 MG/2ML IJ SOLN
INTRAMUSCULAR | Status: AC
  Filled 2019-06-05: qty 2

## 2019-06-05 MED ORDER — OXYCODONE HCL 5 MG PO TABS
5.0000 mg | ORAL_TABLET | ORAL | Status: DC | PRN
  Administered 2019-06-05 – 2019-06-06 (×2): 10 mg via ORAL
  Filled 2019-06-05 (×5): qty 2

## 2019-06-05 MED ORDER — PHENOL 1.4 % MT LIQD: 1.0000 | OROMUCOSAL | Status: DC | PRN

## 2019-06-05 MED ORDER — METHOCARBAMOL 500 MG PO TABS
500.0000 mg | ORAL_TABLET | Freq: Four times a day (QID) | ORAL | Status: DC | PRN
  Administered 2019-06-05 – 2019-06-07 (×4): 500 mg via ORAL
  Filled 2019-06-05 (×4): qty 1

## 2019-06-05 MED ORDER — ONDANSETRON HCL 4 MG/2ML IJ SOLN: 4.0000 mg | Freq: Four times a day (QID) | INTRAMUSCULAR | Status: DC | PRN

## 2019-06-05 MED ORDER — BISACODYL 5 MG PO TBEC: 5.0000 mg | DELAYED_RELEASE_TABLET | Freq: Every day | ORAL | Status: DC | PRN

## 2019-06-05 MED ORDER — DIPHENHYDRAMINE HCL 12.5 MG/5ML PO ELIX: 12.5000 mg | ORAL_SOLUTION | ORAL | Status: DC | PRN

## 2019-06-05 MED ORDER — ONDANSETRON HCL 4 MG PO TABS: 4.0000 mg | ORAL_TABLET | Freq: Four times a day (QID) | ORAL | Status: DC | PRN

## 2019-06-05 MED ORDER — TRANEXAMIC ACID-NACL 1000-0.7 MG/100ML-% IV SOLN
INTRAVENOUS | Status: AC
  Filled 2019-06-05: qty 100

## 2019-06-05 MED ORDER — DOCUSATE SODIUM 100 MG PO CAPS
100.0000 mg | ORAL_CAPSULE | Freq: Two times a day (BID) | ORAL | Status: DC
  Administered 2019-06-05 – 2019-06-07 (×4): 100 mg via ORAL
  Filled 2019-06-05 (×4): qty 1

## 2019-06-05 MED ORDER — PROMETHAZINE HCL 25 MG/ML IJ SOLN: 6.2500 mg | INTRAMUSCULAR | Status: DC | PRN

## 2019-06-05 MED ORDER — LIDOCAINE HCL (CARDIAC) PF 100 MG/5ML IV SOSY
PREFILLED_SYRINGE | INTRAVENOUS | Status: DC | PRN
  Administered 2019-06-05: 20 mg via INTRATRACHEAL

## 2019-06-05 MED ORDER — POLYETHYLENE GLYCOL 3350 17 G PO PACK: 17.0000 g | PACK | Freq: Every day | ORAL | Status: DC | PRN

## 2019-06-05 MED ORDER — LIDOCAINE 2% (20 MG/ML) 5 ML SYRINGE
INTRAMUSCULAR | Status: AC
  Filled 2019-06-05: qty 5

## 2019-06-05 MED ORDER — MIDAZOLAM HCL 2 MG/2ML IJ SOLN: 2.0000 mg | Freq: Once | INTRAMUSCULAR | Status: AC

## 2019-06-05 MED ORDER — HYDROMORPHONE HCL 1 MG/ML IJ SOLN
0.5000 mg | INTRAMUSCULAR | Status: DC | PRN
  Administered 2019-06-05: 0.5 mg via INTRAVENOUS
  Administered 2019-06-06 (×3): 1 mg via INTRAVENOUS
  Filled 2019-06-05 (×4): qty 1

## 2019-06-05 MED ORDER — ACETAMINOPHEN 10 MG/ML IV SOLN
INTRAVENOUS | Status: DC | PRN
  Administered 2019-06-05: 1000 mg via INTRAVENOUS

## 2019-06-05 MED ORDER — ACETAMINOPHEN 10 MG/ML IV SOLN
INTRAVENOUS | Status: AC
  Filled 2019-06-05: qty 100

## 2019-06-05 MED ORDER — METHOCARBAMOL 1000 MG/10ML IJ SOLN: 500.0000 mg | Freq: Four times a day (QID) | INTRAVENOUS | Status: DC | PRN

## 2019-06-05 MED ORDER — TRANEXAMIC ACID 1000 MG/10ML IV SOLN
2000.0000 mg | Freq: Once | INTRAVENOUS | Status: DC
  Filled 2019-06-05: qty 20

## 2019-06-05 MED ORDER — TRANEXAMIC ACID 1000 MG/10ML IV SOLN
INTRAVENOUS | Status: DC | PRN
  Administered 2019-06-05: 2000 mg via TOPICAL

## 2019-06-05 MED ORDER — GABAPENTIN 100 MG PO CAPS
100.0000 mg | ORAL_CAPSULE | Freq: Three times a day (TID) | ORAL | Status: DC
  Administered 2019-06-05 – 2019-06-07 (×7): 100 mg via ORAL
  Filled 2019-06-05 (×7): qty 1

## 2019-06-05 MED ORDER — PROPOFOL 10 MG/ML IV BOLUS
INTRAVENOUS | Status: DC | PRN
  Administered 2019-06-05: 100 mg via INTRAVENOUS
  Administered 2019-06-05: 50 mg via INTRAVENOUS
  Administered 2019-06-05: 150 mg via INTRAVENOUS

## 2019-06-05 MED ORDER — ONDANSETRON HCL 4 MG/2ML IJ SOLN
INTRAMUSCULAR | Status: DC | PRN
  Administered 2019-06-05: 4 mg via INTRAVENOUS

## 2019-06-05 MED ORDER — CLONIDINE HCL (ANALGESIA) 100 MCG/ML EP SOLN
EPIDURAL | Status: DC | PRN
  Administered 2019-06-05: 50 ug

## 2019-06-05 MED ORDER — PANTOPRAZOLE SODIUM 40 MG PO TBEC
40.0000 mg | DELAYED_RELEASE_TABLET | Freq: Every day | ORAL | Status: DC
  Administered 2019-06-05 – 2019-06-07 (×3): 40 mg via ORAL
  Filled 2019-06-05 (×3): qty 1

## 2019-06-05 MED ORDER — SODIUM CHLORIDE 0.9 % IR SOLN
Status: DC | PRN
  Administered 2019-06-05: 3000 mL

## 2019-06-05 MED ORDER — FENTANYL CITRATE (PF) 100 MCG/2ML IJ SOLN
INTRAMUSCULAR | Status: AC
  Filled 2019-06-05: qty 2

## 2019-06-05 MED ORDER — MIDAZOLAM HCL 2 MG/2ML IJ SOLN
INTRAMUSCULAR | Status: DC | PRN
  Administered 2019-06-05: 2 mg via INTRAVENOUS

## 2019-06-05 MED ORDER — CEFAZOLIN SODIUM-DEXTROSE 1-4 GM/50ML-% IV SOLN
1.0000 g | Freq: Four times a day (QID) | INTRAVENOUS | Status: AC
  Administered 2019-06-05 – 2019-06-06 (×2): 1 g via INTRAVENOUS
  Filled 2019-06-05 (×2): qty 50

## 2019-06-05 MED ORDER — ROPIVACAINE HCL 5 MG/ML IJ SOLN
INTRAMUSCULAR | Status: DC | PRN
  Administered 2019-06-05: 20 mL via PERINEURAL

## 2019-06-05 MED ORDER — TRANEXAMIC ACID-NACL 1000-0.7 MG/100ML-% IV SOLN
1000.0000 mg | INTRAVENOUS | Status: AC
  Administered 2019-06-05: 1000 mg via INTRAVENOUS

## 2019-06-05 MED ORDER — FENTANYL CITRATE (PF) 250 MCG/5ML IJ SOLN: INTRAMUSCULAR | Status: DC | PRN

## 2019-06-05 MED ORDER — ACETAMINOPHEN 325 MG PO TABS
325.0000 mg | ORAL_TABLET | Freq: Four times a day (QID) | ORAL | Status: DC | PRN
  Administered 2019-06-07: 09:00:00 650 mg via ORAL
  Filled 2019-06-05: qty 2

## 2019-06-05 MED ORDER — MIDAZOLAM HCL 2 MG/2ML IJ SOLN
INTRAMUSCULAR | Status: AC
  Filled 2019-06-05: qty 2

## 2019-06-05 MED ORDER — OXYCODONE HCL 5 MG PO TABS
10.0000 mg | ORAL_TABLET | ORAL | Status: DC | PRN
  Administered 2019-06-06 – 2019-06-07 (×5): 10 mg via ORAL
  Filled 2019-06-05: qty 2
  Filled 2019-06-05: qty 3

## 2019-06-05 MED ORDER — FENTANYL CITRATE (PF) 100 MCG/2ML IJ SOLN
INTRAMUSCULAR | Status: AC
  Administered 2019-06-05: 50 ug via INTRAVENOUS
  Filled 2019-06-05: qty 2

## 2019-06-05 MED ORDER — MENTHOL 3 MG MT LOZG: 1.0000 | LOZENGE | OROMUCOSAL | Status: DC | PRN

## 2019-06-05 MED ORDER — SODIUM CHLORIDE 0.9 % IV SOLN: INTRAVENOUS | Status: DC

## 2019-06-05 MED ORDER — 0.9 % SODIUM CHLORIDE (POUR BTL) OPTIME
TOPICAL | Status: DC | PRN
  Administered 2019-06-05: 1000 mL

## 2019-06-05 MED ORDER — POVIDONE-IODINE 10 % EX SWAB
2.0000 "application " | Freq: Once | CUTANEOUS | Status: AC
  Administered 2019-06-05: 2 via TOPICAL

## 2019-06-05 MED ORDER — FENTANYL CITRATE (PF) 100 MCG/2ML IJ SOLN
25.0000 ug | INTRAMUSCULAR | Status: DC | PRN
  Administered 2019-06-05 (×2): 25 ug via INTRAVENOUS

## 2019-06-05 MED ORDER — FENTANYL CITRATE (PF) 250 MCG/5ML IJ SOLN
INTRAMUSCULAR | Status: DC | PRN
  Administered 2019-06-05: 25 ug via INTRAVENOUS
  Administered 2019-06-05: 50 ug via INTRAVENOUS
  Administered 2019-06-05: 25 ug via INTRAVENOUS

## 2019-06-05 MED ORDER — LACTATED RINGERS IV SOLN: INTRAVENOUS | Status: DC

## 2019-06-05 SURGICAL SUPPLY — 77 items
BANDAGE ESMARK 6X9 LF (GAUZE/BANDAGES/DRESSINGS) ×1 IMPLANT
BEARIN INSERT TIBIAL 11 (Orthopedic Implant) ×2 IMPLANT
BEARIN INSERT TIBIAL 11MM (Orthopedic Implant) ×1 IMPLANT
BEARING INSERT TIBIAL 11 (Orthopedic Implant) IMPLANT
BLADE SAG 18X100X1.27 (BLADE) ×3 IMPLANT
BNDG CMPR 9X6 STRL LF SNTH (GAUZE/BANDAGES/DRESSINGS) ×1
BNDG ELASTIC 4X5.8 VLCR STR LF (GAUZE/BANDAGES/DRESSINGS) ×2 IMPLANT
BNDG ELASTIC 6X5.8 VLCR STR LF (GAUZE/BANDAGES/DRESSINGS) ×4 IMPLANT
BNDG ESMARK 6X9 LF (GAUZE/BANDAGES/DRESSINGS) ×3
BOWL SMART MIX CTS (DISPOSABLE) ×3 IMPLANT
BSPLAT TIB 3 KN TRITANIUM (Knees) ×1 IMPLANT
CLOSURE STERI-STRIP 1/2X4 (GAUZE/BANDAGES/DRESSINGS) ×1
CLOSURE WOUND 1/2 X4 (GAUZE/BANDAGES/DRESSINGS)
CLSR STERI-STRIP ANTIMIC 1/2X4 (GAUZE/BANDAGES/DRESSINGS) ×1 IMPLANT
COVER SURGICAL LIGHT HANDLE (MISCELLANEOUS) ×3 IMPLANT
COVER WAND RF STERILE (DRAPES) ×3 IMPLANT
CUFF TOURN SGL QUICK 34 (TOURNIQUET CUFF) ×3
CUFF TOURN SGL QUICK 42 (TOURNIQUET CUFF) IMPLANT
CUFF TRNQT CYL 34X4.125X (TOURNIQUET CUFF) ×1 IMPLANT
DRAPE EXTREMITY T 121X128X90 (DISPOSABLE) ×3 IMPLANT
DRAPE HALF SHEET 40X57 (DRAPES) ×3 IMPLANT
DRAPE U-SHAPE 47X51 STRL (DRAPES) ×3 IMPLANT
DRSG PAD ABDOMINAL 8X10 ST (GAUZE/BANDAGES/DRESSINGS) ×3 IMPLANT
DRSG XEROFORM 1X8 (GAUZE/BANDAGES/DRESSINGS) ×2 IMPLANT
DURAPREP 26ML APPLICATOR (WOUND CARE) ×3 IMPLANT
ELECT CAUTERY BLADE 6.4 (BLADE) ×3 IMPLANT
ELECT REM PT RETURN 9FT ADLT (ELECTROSURGICAL) ×3
ELECTRODE REM PT RTRN 9FT ADLT (ELECTROSURGICAL) ×1 IMPLANT
FACESHIELD WRAPAROUND (MASK) ×6 IMPLANT
FACESHIELD WRAPAROUND OR TEAM (MASK) ×2 IMPLANT
FEMORAL POSTERIOR SZ3 LT KNEE (Orthopedic Implant) IMPLANT
GAUZE SPONGE 4X4 12PLY STRL (GAUZE/BANDAGES/DRESSINGS) ×3 IMPLANT
GAUZE XEROFORM 1X8 LF (GAUZE/BANDAGES/DRESSINGS) ×3 IMPLANT
GLOVE BIOGEL PI IND STRL 8 (GLOVE) ×2 IMPLANT
GLOVE BIOGEL PI INDICATOR 8 (GLOVE) ×4
GLOVE ORTHO TXT STRL SZ7.5 (GLOVE) ×3 IMPLANT
GLOVE SURG ORTHO 8.0 STRL STRW (GLOVE) ×3 IMPLANT
GOWN STRL REUS W/ TWL LRG LVL3 (GOWN DISPOSABLE) IMPLANT
GOWN STRL REUS W/ TWL XL LVL3 (GOWN DISPOSABLE) ×2 IMPLANT
GOWN STRL REUS W/TWL LRG LVL3 (GOWN DISPOSABLE)
GOWN STRL REUS W/TWL XL LVL3 (GOWN DISPOSABLE) ×6
HANDPIECE INTERPULSE COAX TIP (DISPOSABLE) ×3
IMMOBILIZER KNEE 22 UNIV (SOFTGOODS) ×3 IMPLANT
KIT BASIN OR (CUSTOM PROCEDURE TRAY) ×3 IMPLANT
KIT TURNOVER KIT B (KITS) ×3 IMPLANT
KNEE PATELLA ASYMMETRIC 9X29 (Knees) ×2 IMPLANT
KNEE TIBIAL COMPONENT SZ3 (Knees) ×2 IMPLANT
MANIFOLD NEPTUNE II (INSTRUMENTS) ×3 IMPLANT
NDL 18GX1X1/2 (RX/OR ONLY) (NEEDLE) IMPLANT
NEEDLE 18GX1X1/2 (RX/OR ONLY) (NEEDLE) IMPLANT
NS IRRIG 1000ML POUR BTL (IV SOLUTION) ×3 IMPLANT
PACK TOTAL JOINT (CUSTOM PROCEDURE TRAY) ×3 IMPLANT
PAD ARMBOARD 7.5X6 YLW CONV (MISCELLANEOUS) ×3 IMPLANT
PAD CAST 4YDX4 CTTN HI CHSV (CAST SUPPLIES) IMPLANT
PADDING CAST COTTON 4X4 STRL (CAST SUPPLIES) ×3
PADDING CAST COTTON 6X4 STRL (CAST SUPPLIES) ×3 IMPLANT
PIN FLUTED HEDLESS FIX 3.5X1/8 (PIN) ×2 IMPLANT
POSTERIOR FEMORAL SZ3 LT KNEE (Orthopedic Implant) ×3 IMPLANT
SET HNDPC FAN SPRY TIP SCT (DISPOSABLE) ×1 IMPLANT
SET PAD KNEE POSITIONER (MISCELLANEOUS) ×3 IMPLANT
STAPLER SKIN PROX 35W (STAPLE) ×2 IMPLANT
STAPLER VISISTAT 35W (STAPLE) IMPLANT
STRIP CLOSURE SKIN 1/2X4 (GAUZE/BANDAGES/DRESSINGS) IMPLANT
SUCTION FRAZIER HANDLE 10FR (MISCELLANEOUS) ×3
SUCTION TUBE FRAZIER 10FR DISP (MISCELLANEOUS) ×1 IMPLANT
SUT MNCRL AB 4-0 PS2 18 (SUTURE) IMPLANT
SUT VIC AB 0 CT1 27 (SUTURE) ×3
SUT VIC AB 0 CT1 27XBRD ANBCTR (SUTURE) ×1 IMPLANT
SUT VIC AB 1 CT1 27 (SUTURE) ×6
SUT VIC AB 1 CT1 27XBRD ANBCTR (SUTURE) ×2 IMPLANT
SUT VIC AB 2-0 CT1 27 (SUTURE) ×6
SUT VIC AB 2-0 CT1 TAPERPNT 27 (SUTURE) ×2 IMPLANT
SYR 50ML LL SCALE MARK (SYRINGE) IMPLANT
TOWEL GREEN STERILE (TOWEL DISPOSABLE) ×3 IMPLANT
TOWEL GREEN STERILE FF (TOWEL DISPOSABLE) ×3 IMPLANT
TRAY CATH 16FR W/PLASTIC CATH (SET/KITS/TRAYS/PACK) IMPLANT
WRAP KNEE MAXI GEL POST OP (GAUZE/BANDAGES/DRESSINGS) ×3 IMPLANT

## 2019-06-05 NOTE — H&P (Signed)
TOTAL KNEE ADMISSION H&P  Patient is being admitted for left total knee arthroplasty.  Subjective:  Chief Complaint:left knee pain.  HPI: Tiffany Stout, 54 y.o. female, has a history of pain and functional disability in the left knee due to arthritis and has failed non-surgical conservative treatments for greater than 12 weeks to includeNSAID's and/or analgesics, corticosteriod injections, viscosupplementation injections, flexibility and strengthening excercises, use of assistive devices, weight reduction as appropriate and activity modification.  Onset of symptoms was gradual, starting 4 years ago with gradually worsening course since that time. The patient noted no past surgery on the left knee(s).  Patient currently rates pain in the left knee(s) at 10 out of 10 with activity. Patient has night pain, worsening of pain with activity and weight bearing, pain that interferes with activities of daily living, pain with passive range of motion, crepitus and joint swelling.  Patient has evidence of subchondral sclerosis, periarticular osteophytes and joint space narrowing by imaging studies. There is no active infection.  Patient Active Problem List   Diagnosis Date Noted  . Unilateral primary osteoarthritis, left knee 04/09/2019  . Unilateral primary osteoarthritis, right knee 04/09/2019  . IBS (irritable bowel syndrome)   . GERD (gastroesophageal reflux disease) 11/08/2011   Past Medical History:  Diagnosis Date  . Anxiety   . Arthritis   . Fibromyalgia   . GERD (gastroesophageal reflux disease)   . Hypertension   . IBS (irritable bowel syndrome)     Past Surgical History:  Procedure Laterality Date  . ABDOMINAL HYSTERECTOMY    . CHOLECYSTECTOMY    . ESOPHAGOGASTRODUODENOSCOPY  11/11/2011   Procedure: ESOPHAGOGASTRODUODENOSCOPY (EGD);  Surgeon: Malissa Hippo, MD;  Location: [facility];  Service: Endoscopy;  Laterality: N/A;  315  . TONSILLECTOMY      Current Facility-Administered  Medications  Medication Dose Route Frequency Provider Last Rate Last Admin  . ceFAZolin (ANCEF) 2-4 GM/100ML-% IVPB           . ceFAZolin (ANCEF) IVPB 2g/100 mL premix  2 g Intravenous On Call to OR Kirtland Bouchard, PA-C      . fentaNYL (SUBLIMAZE) 100 MCG/2ML injection           . lactated ringers infusion   Intravenous Continuous Marcene Duos, MD 10 mL/hr at 06/05/19 1100 New Bag at 06/05/19 1100  . midazolam (VERSED) 2 MG/2ML injection           . tranexamic acid (CYKLOKAPRON) 1000MG /161mL IVPB           . tranexamic acid (CYKLOKAPRON) 2,000 mg in sodium chloride 0.9 % 50 mL Topical Application  2,000 mg Topical Once 80m, MD      . tranexamic acid (CYKLOKAPRON) IVPB 1,000 mg  1,000 mg Intravenous To OR Kathryne Hitch, PA-C       No Known Allergies  Social History   Tobacco Use  . Smoking status: Former Kirtland Bouchard  . Smokeless tobacco: Never Used  . Tobacco comment: Quit 20 years ago  Substance Use Topics  . Alcohol use: No    History reviewed. No pertinent family history.   Review of Systems  Musculoskeletal: Positive for joint swelling.  All other systems reviewed and are negative.   Objective:  Physical Exam  Constitutional: She is oriented to person, place, and time. She appears well-developed and well-nourished.  HENT:  Head: Normocephalic and atraumatic.  Eyes: Pupils are equal, round, and reactive to light. EOM are normal.  Cardiovascular: Normal rate.  Respiratory: Effort  normal.  GI: Soft.  Musculoskeletal:     Cervical back: Normal range of motion and neck supple.     Left knee: Swelling, effusion and bony tenderness present. Decreased range of motion. Tenderness present over the medial joint line and lateral joint line. Abnormal alignment and abnormal meniscus.  Neurological: She is alert and oriented to person, place, and time.  Skin: Skin is warm and dry.  Psychiatric: She has a normal mood and affect.    Vital signs in last 24  hours: Temp:  [98.1 F (36.7 C)] 98.1 F (36.7 C) (04/27 1014) Pulse Rate:  [76] 76 (04/27 1014) Resp:  [17] 17 (04/27 1014) BP: (167)/(92) 167/92 (04/27 1014) SpO2:  [100 %] 100 % (04/27 1014) Weight:  [65.3 kg] 65.3 kg (04/27 1014)  Labs:   Estimated body mass index is 23.96 kg/m as calculated from the following:   Height as of this encounter: 5\' 5"  (1.651 m).   Weight as of this encounter: 65.3 kg.   Imaging Review Plain radiographs demonstrate severe degenerative joint disease of the left knee(s). The overall alignment ismild varus. The bone quality appears to be good for age and reported activity level.      Assessment/Plan:  End stage arthritis, left knee   The patient history, physical examination, clinical judgment of the provider and imaging studies are consistent with end stage degenerative joint disease of the left knee(s) and total knee arthroplasty is deemed medically necessary. The treatment options including medical management, injection therapy arthroscopy and arthroplasty were discussed at length. The risks and benefits of total knee arthroplasty were presented and reviewed. The risks due to aseptic loosening, infection, stiffness, patella tracking problems, thromboembolic complications and other imponderables were discussed. The patient acknowledged the explanation, agreed to proceed with the plan and consent was signed. Patient is being admitted for inpatient treatment for surgery, pain control, PT, OT, prophylactic antibiotics, VTE prophylaxis, progressive ambulation and ADL's and discharge planning. The patient is planning to be discharged home with home health services     Patient's anticipated LOS is less than 2 midnights, meeting these requirements: - Younger than 29 - Lives within 1 hour of care - Has a competent adult at home to recover with post-op recover - NO history of  - Chronic pain requiring opiods  - Diabetes  - Coronary Artery Disease  -  Heart failure  - Heart attack  - Stroke  - DVT/VTE  - Cardiac arrhythmia  - Respiratory Failure/COPD  - Renal failure  - Anemia  - Advanced Liver disease

## 2019-06-05 NOTE — Anesthesia Procedure Notes (Signed)
Procedure Name: LMA Insertion Date/Time: 06/05/2019 12:31 PM Performed by: Modena Morrow, CRNA Pre-anesthesia Checklist: Emergency Drugs available, Patient identified, Suction available and Patient being monitored Patient Re-evaluated:Patient Re-evaluated prior to induction Oxygen Delivery Method: Circle system utilized Preoxygenation: Pre-oxygenation with 100% oxygen Induction Type: IV induction Ventilation: Mask ventilation without difficulty LMA: LMA inserted LMA Size: 4.0 Number of attempts: 1 Placement Confirmation: positive ETCO2 Tube secured with: Tape Dental Injury: Teeth and Oropharynx as per pre-operative assessment

## 2019-06-05 NOTE — Anesthesia Preprocedure Evaluation (Addendum)
Anesthesia Evaluation  Patient identified by MRN, date of birth, ID band Patient awake    Reviewed: Allergy & Precautions, NPO status , Patient's Chart, lab work & pertinent test results  Airway Mallampati: II  TM Distance: >3 FB Neck ROM: Full    Dental  (+) Dental Advisory Given   Pulmonary former smoker,    breath sounds clear to auscultation       Cardiovascular hypertension,  Rhythm:Regular Rate:Normal     Neuro/Psych negative neurological ROS     GI/Hepatic Neg liver ROS, GERD  ,  Endo/Other  negative endocrine ROS  Renal/GU negative Renal ROS     Musculoskeletal  (+) Arthritis , Fibromyalgia -  Abdominal   Peds  Hematology negative hematology ROS (+)   Anesthesia Other Findings   Reproductive/Obstetrics                             Lab Results  Component Value Date   WBC 6.0 06/01/2019   HGB 14.1 06/01/2019   HCT 44.3 06/01/2019   MCV 97.6 06/01/2019   PLT 236 06/01/2019   Lab Results  Component Value Date   CREATININE 0.50 06/01/2019   BUN 12 06/01/2019   NA 138 06/01/2019   K 3.7 06/01/2019   CL 105 06/01/2019   CO2 26 06/01/2019    Anesthesia Physical Anesthesia Plan  ASA: II  Anesthesia Plan: General   Post-op Pain Management:  Regional for Post-op pain   Induction: Intravenous  PONV Risk Score and Plan: 3 and Dexamethasone, Ondansetron and Treatment may vary due to age or medical condition  Airway Management Planned: LMA  Additional Equipment: None  Intra-op Plan:   Post-operative Plan: Extubation in OR  Informed Consent: I have reviewed the patients History and Physical, chart, labs and discussed the procedure including the risks, benefits and alternatives for the proposed anesthesia with the patient or authorized representative who has indicated his/her understanding and acceptance.     Dental advisory given  Plan Discussed with:  CRNA  Anesthesia Plan Comments:        Anesthesia Quick Evaluation

## 2019-06-05 NOTE — Op Note (Signed)
NAME: Tiffany Stout, Tiffany Stout MEDICAL RECORD AC:[id number] ACCOUNT [id number] DATE OF BIRTH:1965-12-01 FACILITY: [facility] LOCATION: [facility] PHYSICIAN:Woodley Petzold Kerry [place], MD  OPERATIVE REPORT  DATE OF PROCEDURE:  06/05/2019  PREOPERATIVE DIAGNOSIS:  Primary osteoarthritis and degenerative joint disease, left knee.  POSTOPERATIVE DIAGNOSIS:  Primary osteoarthritis and degenerative joint disease, left knee.  PROCEDURE:  Left total knee arthroplasty.  IMPLANTS:  Stryker Triathlon press-fit knee system with size 3 femur, size 3 tibial tray, 11 mm fixed bearing polyethylene insert, size 29 patellar button.  SURGEON:  Lind Guest. Ninfa Linden, MD   ASSISTANT:  Erskine Emery, PA-C  ANESTHESIA: 1.  Left lower extremity adductor canal block. 2.  General via LMA.  ANTIBIOTICS:  2 grams IV Ancef.  TOURNIQUET TIME:  Less than 1 hour.  ESTIMATED BLOOD LOSS:  Less than 100 mL.  COMPLICATIONS:  None.  INDICATIONS:  The patient is a very pleasant 54 year old patient well known to me.  She has debilitating arthritis involving both of her knees with the left hurting worse than the right.  At this point, she has tried and failed all forms of conservative  treatment.  Her pain is daily and is detrimentally affecting her mobility, her quality of life, and her activities of daily living.  At this point, she does wish to proceed with total knee arthroplasty given the fairly conservative treatment.  We talked  about the risk of acute blood loss anemia, nerve or vessel injury, fracture, infection, DVT, and implant failure.  We talked about the goals being decreased pain, improved mobility, and overall improve quality of life.  DESCRIPTION OF PROCEDURE:  After informed consent was obtained and appropriate left knee was marked an adductor canal block was obtained of her left lower extremity in the holding room.  She was then brought to the operating room and placed supine on the  operating table.  General  anesthesia was then obtained.  A nonsterile tourniquet was placed on her upper left thigh.  Knee, leg, ankle and foot were prepped and draped with DuraPrep and sterile drapes.  A timeout was called and she identified correct  patient, correct left knee.  I then made an incision over the patella and carried this proximally and distally.  We dissected down the knee joint carried out and carried out a medial parapatellar arthrotomy finding a moderate joint effusion and  significant osteoarthritis in the medial compartment of the knee and the patellofemoral joint.  With the knee in a flexed position, we removed remnants of ACL, PCL, medial and lateral meniscus.  We then made our tibial cut using an extramedullary cutting  guide for correction of varus and valgus and neutral slope cutting this to take 2 mm off the high side.  After making that cut, we did feel like we should take additional 2 mm, which we did.  We then used an intramedullary drill for making our distal  femoral cut and placing our alignment guide down the femoral canal.  We did this for a left knee at an 8 mm distal femoral cut setting this cutting guide at 5 degrees externally rotated.  We made this cut without difficulty and brought the knee back down  to full extension.  We cleaned more debris from the knee and with a 9 mm extension block had achieved full extension.  We then went back to the knee in a flexed position and placed our femoral sizing guide based off the epicondylar axis and Whitesides  line.  Based off this, we chose a  size 3 femur.  We put a 4-in-1 cutting block for a size 3 femur, made our anterior and posterior cuts, followed by our chamfer cuts.  We then made our femoral box cut.  Attention was then turned back to the tibia.  We  chose a size 3 tibial tray for coverage setting the rotation off the tibial tubercle and the femur.  Based on her quality of bone we did feel like press-fit implants were appropriate.  We did our keel  punch for a size 3 tibia and so with the size 3 trial  tibia we trialed a size 3 left femoral component and placed a 9 mm fixed bearing polyethylene insert.  We did feel like had given her full extension, but we just did a little bit of loosening and we felt that the real implant should be 11 mm insert.  We  then made our patellar cut and drilled 3 holes for a size 29 press-fit patellar button.  We then removed all instrumentation from the knee and irrigated the knee with normal saline solution using pulsatile lavage.  We dried the knee real well and then  with the knee in a flexed position, placed our real Stryker Triathlon press-fit tibial component size 3 followed by our size 3 press-fit left femur.  We placed our real 11 mm fixed bearing polyethylene insert and press-fit our 29 patellar button.  I put  the knee through several cycles of range of motion.  I was pleased with range of motion and stability and the contact between the tibia, femur and the polyethylene component.  With the knee held in full extended position, we then let the tourniquet down  and hemostasis was obtained with electrocautery.  We then irrigated the knee again with normal saline solution.  Closed the arthrotomy with interrupted #1 Vicryl suture followed by 0 Vicryl to close deep tissue, 2-0 Vicryl was used to close subcutaneous  tissue and staples were used to reapproximate the skin.  Xeroform well-padded sterile dressing was applied.  She was awakened, extubated, and taken to recovery room in stable condition.  All final counts were correct.  There were no complications noted.     Of note, Rexene Edison, PA-C, assisted during the entire case.  His assistance was crucial for facilitating all aspects of this case.  CN/NUANCE  D:06/05/2019 T:06/05/2019 JOB:[id number]

## 2019-06-05 NOTE — Plan of Care (Signed)
  Problem: Education: Goal: Knowledge of General Education information will improve Description: Including pain rating scale, medication(s)/side effects and non-pharmacologic comfort measures Outcome: Progressing   Problem: Clinical Measurements: Goal: Respiratory complications will improve Outcome: Progressing Note: On room air   Problem: Activity: Goal: Risk for activity intolerance will decrease Outcome: Progressing Note: Up to [facility] with walker and 1 assist, tolerated fair, then back in bed, was on CPM for about 2 hrs   Problem: Nutrition: Goal: Adequate nutrition will be maintained Outcome: Progressing   Problem: Coping: Goal: Level of anxiety will decrease Outcome: Progressing   Problem: Elimination: Goal: Will not experience complications related to urinary retention Outcome: Progressing   Problem: Pain Managment: Goal: General experience of comfort will improve Outcome: Progressing Note: Treated PRN pain to left knee with oxycodone   Problem: Safety: Goal: Ability to remain free from injury will improve Outcome: Progressing

## 2019-06-05 NOTE — Progress Notes (Signed)
PT Cancellation Note  Patient Details Name: GLENDINE SWETZ MRN: [id number] DOB: 05-19-1965   Cancelled Treatment:    Reason Eval/Treat Not Completed: Other (comment) Pt's orders released, however, currently not in room. Will follow up as pt gets to room and as schedule allows.   Cindee Salt, DPT  Acute Rehabilitation Services  Pager: [phone] Office: [phone]  Lehman Prom 06/05/2019, 4:56 PM

## 2019-06-05 NOTE — Transfer of Care (Signed)
Immediate Anesthesia Transfer of Care Note  Patient: Tiffany Stout  Procedure(s) Performed: LEFT TOTAL KNEE ARTHROPLASTY (Left Knee)  Patient Location: PACU  Anesthesia Type:General and Regional  Level of Consciousness: drowsy and patient cooperative  Airway & Oxygen Therapy: Patient Spontanous Breathing and Patient connected to nasal cannula oxygen  Post-op Assessment: Report given to RN and Post -op Vital signs reviewed and stable  Post vital signs: Reviewed and stable  Last Vitals:  Vitals Value Taken Time  BP 153/93 06/05/19 1401  Temp    Pulse 93 06/05/19 1402  Resp 13 06/05/19 1402  SpO2 100 % 06/05/19 1402  Vitals shown include unvalidated device data.  Last Pain:  Vitals:   06/05/19 1200  TempSrc:   PainSc: 0-No pain         Complications: No apparent anesthesia complications

## 2019-06-05 NOTE — Anesthesia Postprocedure Evaluation (Signed)
Anesthesia Post Note  Patient: Tiffany Stout  Procedure(s) Performed: LEFT TOTAL KNEE ARTHROPLASTY (Left Knee)     Patient location during evaluation: PACU Anesthesia Type: General Level of consciousness: awake and alert Pain management: pain level controlled Vital Signs Assessment: post-procedure vital signs reviewed and stable Respiratory status: spontaneous breathing, nonlabored ventilation, respiratory function stable and patient connected to nasal cannula oxygen Cardiovascular status: blood pressure returned to baseline and stable Postop Assessment: no apparent nausea or vomiting Anesthetic complications: no    Last Vitals:  Vitals:   06/05/19 1416 06/05/19 1419  BP: (!) 170/123 (!) 143/68  Pulse: 98 95  Resp: (!) 21 (!) 24  Temp:    SpO2: 100% 100%    Last Pain:  Vitals:   06/05/19 1419  TempSrc:   PainSc: 0-No pain                 Kennieth Rad

## 2019-06-05 NOTE — Anesthesia Procedure Notes (Signed)
Anesthesia Regional Block: Adductor canal block   Pre-Anesthetic Checklist: ,, timeout performed, Correct Patient, Correct Site, Correct Laterality, Correct Procedure, Correct Position, site marked, Risks and benefits discussed,  Surgical consent,  Pre-op evaluation,  At surgeon's request and post-op pain management  Laterality: Left  Prep: chloraprep       Needles:  Injection technique: Single-shot  Needle Type: Echogenic Needle     Needle Length: 9cm  Needle Gauge: 21     Additional Needles:   Procedures:,,,, ultrasound used (permanent image in chart),,,,  Narrative:  Start time: 06/05/2019 11:40 AM End time: 06/05/2019 11:47 AM Injection made incrementally with aspirations every 5 mL.  Performed by: Personally  Anesthesiologist: Marcene Duos, MD

## 2019-06-05 NOTE — Evaluation (Signed)
Physical Therapy Evaluation Patient Details Name: Tiffany Stout MRN: [id number] DOB: 25-Jun-1965 Today's Date: 06/05/2019   History of Present Illness  Pt is a 54 y/o female s/p L TKA. PMH inclues anxiety, fibromyalgia, and HTN.   Clinical Impression  Pt is s/p surgery above with deficits below. Pt limited secondary to pain this session. Also with L knee buckling during transfer, requiring min A for steadying. Educated about knee precautions. Will continue to follow acutely to maximize functional mobility independence and safety.      Follow Up Recommendations Follow surgeon's recommendation for DC plan and follow-up therapies;Supervision for mobility/OOB    Equipment Recommendations  Rolling walker with 5" wheels;3in1 (PT)    Recommendations for Other Services OT consult     Precautions / Restrictions Precautions Precautions: Knee;Fall Precaution Booklet Issued: No Precaution Comments: Verbally reviewed knee precautions.  Restrictions Weight Bearing Restrictions: Yes LLE Weight Bearing: Weight bearing as tolerated      Mobility  Bed Mobility Overal bed mobility: Needs Assistance Bed Mobility: Supine to Sit;Sit to Supine     Supine to sit: Min guard Sit to supine: Min assist   General bed mobility comments: Min guard for safety to come to sitting. Min A for LLE assist to return to supine.   Transfers Overall transfer level: Needs assistance Equipment used: Rolling walker (2 wheeled) Transfers: Sit to/from [facility] Sit to Stand: Min assist Stand pivot transfers: Min assist       General transfer comment: Min A for steadying assist using RW to transfer to/from [facility]. Pt with L knee buckling secondary to block throughout. Cues to press through UEs. Also required cues to keep both hands on RW as pt concerned about her gown opening following toileting.   Ambulation/Gait                Stairs            Wheelchair Mobility    Modified  Rankin (Stroke Patients Only)       Balance Overall balance assessment: Needs assistance Sitting-balance support: No upper extremity supported;Feet supported Sitting balance-Leahy Scale: Fair     Standing balance support: Bilateral upper extremity supported;During functional activity Standing balance-Leahy Scale: Poor Standing balance comment: Reliant on BUE support and external support.                              Pertinent Vitals/Pain Pain Assessment: Faces Faces Pain Scale: Hurts whole lot Pain Location: L knee Pain Descriptors / Indicators: Aching;Guarding;Grimacing Pain Intervention(s): Limited activity within patient's tolerance;Monitored during session;Repositioned    Home Living Family/patient expects to be discharged to:: Private residence Living Arrangements: Spouse/significant other Available Help at Discharge: Family;Available 24 hours/day Type of Home: House Home Access: Stairs to enter Entrance Stairs-Rails: None Entrance Stairs-Number of Steps: 2 Home Layout: One level Home Equipment: None      Prior Function Level of Independence: Independent               Hand Dominance        Extremity/Trunk Assessment   Upper Extremity Assessment Upper Extremity Assessment: Defer to OT evaluation    Lower Extremity Assessment Lower Extremity Assessment: LLE deficits/detail LLE Deficits / Details: Deficits consistent with post op pain and weakness.     Cervical / Trunk Assessment Cervical / Trunk Assessment: Normal  Communication   Communication: No difficulties  Cognition Arousal/Alertness: Awake/alert Behavior During Therapy: Anxious Overall Cognitive Status: Within Functional  Limits for tasks assessed                                 General Comments: Pt anxious throughout mobility; most likely secondary to pain levels.       General Comments General comments (skin integrity, edema, etc.): Pt's daughter and husband  present     Exercises     Assessment/Plan    PT Assessment Patient needs continued PT services  PT Problem List Decreased strength;Decreased balance;Decreased range of motion;Decreased activity tolerance;Decreased mobility;Decreased knowledge of use of DME;Decreased knowledge of precautions;Pain       PT Treatment Interventions DME instruction;Gait training;Stair training;Functional mobility training;Therapeutic activities;Therapeutic exercise;Balance training;Patient/family education    PT Goals (Current goals can be found in the Care Plan section)  Acute Rehab PT Goals Patient Stated Goal: to go home PT Goal Formulation: With patient Time For Goal Achievement: 06/19/19 Potential to Achieve Goals: Good    Frequency 7X/week   Barriers to discharge        Co-evaluation               AM-PAC PT "6 Clicks" Mobility  Outcome Measure Help needed turning from your back to your side while in a flat bed without using bedrails?: A Little Help needed moving from lying on your back to sitting on the side of a flat bed without using bedrails?: A Little Help needed moving to and from a bed to a chair (including a wheelchair)?: A Little Help needed standing up from a chair using your arms (e.g., wheelchair or bedside chair)?: A Little Help needed to walk in hospital room?: A Lot Help needed climbing 3-5 steps with a railing? : A Lot 6 Click Score: 16    End of Session Equipment Utilized During Treatment: Gait belt Activity Tolerance: Patient limited by pain Patient left: in bed;with call bell/phone within reach;with bed alarm set;with nursing/sitter in room;with family/visitor present Nurse Communication: Mobility status PT Visit Diagnosis: Unsteadiness on feet (R26.81);Muscle weakness (generalized) (M62.81)    Time: 8502-7741 PT Time Calculation (min) (ACUTE ONLY): 23 min   Charges:   PT Evaluation $PT Eval Low Complexity: 1 Low PT Treatments $Therapeutic Activity: 8-22  mins        Cindee Salt, DPT  Acute Rehabilitation Services  Pager: [phone] Office: [phone]   Lehman Prom 06/05/2019, 6:24 PM

## 2019-06-05 NOTE — Brief Op Note (Signed)
06/05/2019  1:36 PM  PATIENT:  Tiffany Stout  54 y.o. female  PRE-OPERATIVE DIAGNOSIS:  osteoarthritis left knee  POST-OPERATIVE DIAGNOSIS:  osteoarthritis left knee  PROCEDURE:  Procedure(s): LEFT TOTAL KNEE ARTHROPLASTY (Left)  SURGEON:  Surgeon(s) and Role:    Kathryne Hitch, MD - Primary  PHYSICIAN ASSISTANT: Rexene Edison, PA-C  ANESTHESIA:   regional and general  EBL:  100 mL   COUNTS:  YES  TOURNIQUET:   Total Tourniquet Time Documented: Thigh (Left) - 44 minutes Total: Thigh (Left) - 44 minutes   DICTATION: .Other Dictation: Dictation Number [id number]  PLAN OF CARE: Admit for overnight observation  PATIENT DISPOSITION:  PACU - hemodynamically stable.   Delay start of Pharmacological VTE agent (>24hrs) due to surgical blood loss or risk of bleeding: no

## 2019-06-05 NOTE — Progress Notes (Signed)
Orthopedic Tech Progress Note Patient Details:  Tiffany Stout 1965-11-12 [id number] Patient was not able to reach 60 degrees but was able to reach 40 degrees.  CPM Left Knee CPM Left Knee: On Left Knee Flexion (Degrees): 0 Left Knee Extension (Degrees): 40 Additional Comments: added ice  Post Interventions Patient Tolerated: Fair Instructions Provided: Care of device  Donald Pore 06/05/2019, 3:22 PM

## 2019-06-06 ENCOUNTER — Telehealth: Payer: Self-pay

## 2019-06-06 ENCOUNTER — Encounter: Payer: Self-pay | Admitting: *Deleted

## 2019-06-06 DIAGNOSIS — M170 Bilateral primary osteoarthritis of knee: Secondary | ICD-10-CM | POA: Diagnosis not present

## 2019-06-06 LAB — CBC
HCT: 36.2 % (ref 36.0–46.0)
Hemoglobin: 11.6 g/dL — ABNORMAL LOW (ref 12.0–15.0)
MCH: 31.1 pg (ref 26.0–34.0)
MCHC: 32 g/dL (ref 30.0–36.0)
MCV: 97.1 fL (ref 80.0–100.0)
Platelets: 234 10*3/uL (ref 150–400)
RBC: 3.73 MIL/uL — ABNORMAL LOW (ref 3.87–5.11)
RDW: 12.9 % (ref 11.5–15.5)
WBC: 10.6 10*3/uL — ABNORMAL HIGH (ref 4.0–10.5)
nRBC: 0 % (ref 0.0–0.2)

## 2019-06-06 LAB — BASIC METABOLIC PANEL
Anion gap: 8 (ref 5–15)
BUN: 7 mg/dL (ref 6–20)
CO2: 26 mmol/L (ref 22–32)
Calcium: 8.3 mg/dL — ABNORMAL LOW (ref 8.9–10.3)
Chloride: 104 mmol/L (ref 98–111)
Creatinine, Ser: 0.8 mg/dL (ref 0.44–1.00)
GFR calc Af Amer: >60 mL/min (ref >60)
GFR calc non Af Amer: >60 mL/min (ref >60)
Glucose, Bld: 147 mg/dL — ABNORMAL HIGH (ref 70–99)
Potassium: 3.9 mmol/L (ref 3.5–5.1)
Sodium: 138 mmol/L (ref 135–145)

## 2019-06-06 MED ORDER — KETOROLAC TROMETHAMINE 15 MG/ML IJ SOLN
15.0000 mg | Freq: Four times a day (QID) | INTRAMUSCULAR | Status: DC
  Administered 2019-06-06 – 2019-06-07 (×6): 15 mg via INTRAVENOUS
  Filled 2019-06-06 (×6): qty 1

## 2019-06-06 MED ORDER — ALPRAZOLAM 0.25 MG PO TABS
0.2500 mg | ORAL_TABLET | Freq: Two times a day (BID) | ORAL | Status: DC | PRN
  Administered 2019-06-06: 21:00:00 0.25 mg via ORAL
  Filled 2019-06-06: qty 1

## 2019-06-06 NOTE — Telephone Encounter (Signed)
Kalyani with [facility] called and wanted to see if the pt can be switched to inpatient.   With any question you can call her back @ [phone]

## 2019-06-06 NOTE — TOC Initial Note (Signed)
Transition of Care [facility]) - Initial/Assessment Note    Patient Details  Name: Tiffany Stout MRN: [id number] Date of Birth: Jun 14, 1965  Transition of Care [facility] CM/SW Contact:    Tiffany Labrum, RN Phone Number: 06/06/2019, 4:21 PM  Clinical Narrative:                 Pt is a 54 y/o female s/p L TKA. PMH inclues anxiety, fibromyalgia, and HTN.  Patient lives at home with her husband and daughter will be visiting and helping patient with ADL's for a few weeks.  Patient currently does not have any DME.  [facility] called and stated that there was a question as to the patient being in network with their agency.  I called the patient's insurance company (ref # [id number]) and the patient is covered 80 per visit for PT/OT services.  Patient given choice as to home health company and patient states that she does not have a preference.  The patient's insurance company stated that the patient has a "open network plan" is is not designated to choose any specified companies - her network is open.  [facility] called back and waiting to see if they will provide services and how much each visit costs for PT/OT and I will provide this figure to the family.  Choice was offered for DME company and RW and 3:1 will be ordered from [place].  Expected Discharge Plan: [place] Barriers to Discharge: (Waiting to see if patient is covered in-network with [facility].)   Patient Goals and CMS Choice Patient states their goals for this hospitalization and ongoing recovery are:: I'm in pain today but ready to go home tomorrow most probably. CMS Medicare.gov Compare Post Acute Care list provided to:: Patient Choice offered to / list presented to : Patient  Expected Discharge Plan and Services Expected Discharge Plan: [place] Choice: Durable Medical Equipment Living arrangements for the past 2 months: Single Family Home                                Date [place]: 06/06/19 Time [place]: [id number] Representative spoke with at [place]: Tiffany Stout - waiting to hear back from financials at the agency.  Prior Living Arrangements/Services Living arrangements for the past 2 months: Single Family Home Lives with:: Spouse Patient language and need for interpreter reviewed:: Yes Do you feel safe going back to the place where you live?: Yes      Need for Family Participation in Patient Care: Yes (Comment) Care giver support system in place?: Yes (comment)   Criminal Activity/Legal Involvement Pertinent to Current Situation/Hospitalization: No - Comment as needed  Activities of Daily Living Home Assistive Devices/Equipment: Eyeglasses, Blood pressure cuff ADL Screening (condition at time of admission) Patient's cognitive ability adequate to safely complete daily activities?: Yes Is the patient deaf or have difficulty hearing?: No Does the patient have difficulty seeing, even when wearing glasses/contacts?: No Does the patient have difficulty concentrating, remembering, or making decisions?: No Patient able to express need for assistance with ADLs?: Yes Does the patient have difficulty dressing or bathing?: No Independently performs ADLs?: Yes (appropriate for developmental age) Does the patient have difficulty walking or climbing stairs?: Yes Weakness of Legs: Left Weakness of Arms/Hands: None  Permission Sought/Granted Permission sought to share information with :  Case Manager Permission granted to share information with : Yes, Verbal Permission Granted     Permission granted to share info w AGENCY: [facility].  Permission granted to share info w Relationship: daughter     Emotional Assessment Appearance:: Appears stated age Attitude/Demeanor/Rapport: Engaged Affect (typically observed): Accepting Orientation: : Oriented to Self, Oriented to Place, Oriented to  Time, Oriented to  Situation Alcohol / Substance Use: Not Applicable Psych Involvement: No (comment)  Admission diagnosis:  Status post total left knee replacement [Z96.652] Patient Active Problem List   Diagnosis Date Noted  . Status post total left knee replacement 06/05/2019  . Unilateral primary osteoarthritis, left knee 04/09/2019  . Unilateral primary osteoarthritis, right knee 04/09/2019  . IBS (irritable bowel syndrome)   . GERD (gastroesophageal reflux disease) 11/08/2011   PCP:  Tiffany Specking, MD Pharmacy:   [facility] - [place], [place] - [address] [place] [address] Phone: [phone] Fax: [phone]     Social Determinants of Health (SDOH) Interventions    Readmission Risk Interventions No flowsheet data found.

## 2019-06-06 NOTE — Progress Notes (Signed)
Physical Therapy Treatment Patient Details Name: Tiffany Stout MRN: [id number] DOB: Aug 24, 1965 Today's Date: 06/06/2019    History of Present Illness Pt is a 54 y/o female s/p L TKA. PMH inclues anxiety, fibromyalgia, and HTN.     PT Comments    Pt supine in bed on arrival.  Pt resting with L knee flexed and educated on the importance of resting with L knee extended.  Pt very anxious throughout session.  She became dizzy during gt and BP assessed 98/57.  Pt continues to benefit from acute rehab to progress mobility to prepare for return home.     Follow Up Recommendations  Follow surgeon's recommendation for DC plan and follow-up therapies;Supervision for mobility/OOB     Equipment Recommendations  Rolling walker with 5" wheels;3in1 (PT)    Recommendations for Other Services       Precautions / Restrictions Precautions Precautions: Knee;Fall Precaution Booklet Issued: No Precaution Comments: Verbally reviewed knee precautions. Max VCs to rest in supported extension. Restrictions Weight Bearing Restrictions: Yes LLE Weight Bearing: Weight bearing as tolerated    Mobility  Bed Mobility Overal bed mobility: Needs Assistance Bed Mobility: Supine to Sit     Supine to sit: Supervision     General bed mobility comments: Supervision for safety.  No assistance needed.  Transfers Overall transfer level: Needs assistance Equipment used: Rolling walker (2 wheeled) Transfers: Sit to/from [employer] Sit to Stand: Min guard         General transfer comment: Cues for hand placement and weight bearing in B LEs.  Pt very anxious.  Ambulation/Gait Ambulation/Gait assistance: Min assist Gait Distance (Feet): 48 Feet Assistive device: Rolling walker (2 wheeled) Gait Pattern/deviations: Step-to pattern;Antalgic;Trunk flexed;Decreased stride length;Decreased dorsiflexion - left     General Gait Details: Cues for L heel strike and gt  symmetry.  Pt very anxious  and cautious.  Became dizzy during session.  BP 98/57 post gt, likely lower during gt training.   Stairs             Wheelchair Mobility    Modified Rankin (Stroke Patients Only)       Balance Overall balance assessment: Needs assistance Sitting-balance support: No upper extremity supported;Feet supported Sitting balance-Leahy Scale: Fair       Standing balance-Leahy Scale: Poor                              Cognition Arousal/Alertness: Awake/alert Behavior During Therapy: Anxious Overall Cognitive Status: Within Functional Limits for tasks assessed                                 General Comments: Pt anxious throughout mobility; most likely secondary to pain levels.       Exercises Total Joint Exercises Ankle Circles/Pumps: AROM;Both;Supine;20 reps Quad Sets: AROM;Left;10 reps;Supine Heel Slides: AAROM;Left;10 reps;Supine Hip ABduction/ADduction: AROM;Left;10 reps;Supine Straight Leg Raises: AAROM;Left;10 reps;Supine    General Comments        Pertinent Vitals/Pain Pain Assessment: Faces Faces Pain Scale: Hurts whole lot Pain Location: L knee Pain Descriptors / Indicators: Aching;Guarding;Grimacing Pain Intervention(s): Monitored during session;Repositioned    Home Living                      Prior Function            PT Goals (current goals can now be found in  the care plan section) Acute Rehab PT Goals Patient Stated Goal: to go home PT Goal Formulation: With patient Potential to Achieve Goals: Good Progress towards PT goals: Progressing toward goals    Frequency    7X/week      PT Plan Current plan remains appropriate    Co-evaluation              AM-PAC PT "6 Clicks" Mobility   Outcome Measure  Help needed turning from your back to your side while in a flat bed without using bedrails?: A Little Help needed moving from lying on your back to sitting on the side of a flat bed without using  bedrails?: A Little Help needed moving to and from a bed to a chair (including a wheelchair)?: A Little Help needed standing up from a chair using your arms (e.g., wheelchair or bedside chair)?: A Little Help needed to walk in hospital room?: A Lot Help needed climbing 3-5 steps with a railing? : A Lot 6 Click Score: 16    End of Session Equipment Utilized During Treatment: Gait belt Activity Tolerance: Patient limited by pain Patient left: with call bell/phone within reach;in chair;with chair alarm set Nurse Communication: Mobility status PT Visit Diagnosis: Unsteadiness on feet (R26.81);Muscle weakness (generalized) (M62.81)     Time: 2595-6387 PT Time Calculation (min) (ACUTE ONLY): 34 min  Charges:  $Gait Training: 8-22 mins $Therapeutic Exercise: 8-22 mins                     Erasmo Leventhal , PTA Acute Rehabilitation Services Pager [phone] Office [phone]     Abie Killian Eli Hose 06/06/2019, 9:44 AM

## 2019-06-06 NOTE — Plan of Care (Signed)

## 2019-06-06 NOTE — Discharge Instructions (Signed)

## 2019-06-06 NOTE — Progress Notes (Signed)
Occupational Therapy Evaluation Patient Details Name: Tiffany Stout MRN: [id number] DOB: 12-18-1965 Today's Date: 06/06/2019    History of Present Illness Pt is a 54 y/o female s/p L TKA. PMH inclues anxiety, fibromyalgia, and HTN.    Clinical Impression   PTA pt lived with her spouse, independent in all ADL, IADL, and mobility tasks. Pt does not ambulate with an assistive device and reports 0 falls in the last 6 months. 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 Pt currently independent to mod assist for self-care and mobility tasks. Educated pt on safety strategies and activity pacing techniques to increase balance and prevent future falls with fair understanding. Pt able to ambulate to/from bathroom with RW and min guard, requiring mod cues for safety with RW. Pt completed toileting task and tolerated standing ~2 min at the sink to wash hands. Pt very anxious throughout requiring cues to implement breathing techniques. Pt demonstrated decreased strength, endurance, balance, standing tolerance, activity tolerance, and safety awareness impacting ability to complete self-care and functional transfer tasks. Recommend skilled OT services to address above deficits in order to promote function and prevent further decline.     Follow Up Recommendations  Home health OT;Supervision - Intermittent    Equipment Recommendations  Other (comment)(commode vs tub transfer bench depending on progress)    Recommendations for Other Services       Precautions / Restrictions Precautions Precautions: Knee;Fall Precaution Booklet Issued: No Precaution Comments: Verbally reviewed knee precautions.  Restrictions Weight Bearing Restrictions: Yes LLE Weight Bearing: Weight bearing as tolerated      Mobility Bed Mobility Overal bed mobility: Needs Assistance Bed Mobility: Supine to Sit;Sit to Supine     Supine to sit: Supervision;HOB elevated Sit to supine: Min assist;HOB  elevated   General bed mobility comments: Supervision for safety. Assist to LLE back into bed. Educated pt on compensatory strategies with pt declining to attempt this date due to pain.   Transfers Overall transfer level: Needs assistance Equipment used: Rolling walker (2 wheeled) Transfers: Sit to/from Stand Sit to Stand: Min guard         General transfer comment: To ensure balance and safety. Cues for hand placement.     Balance Overall balance assessment: Needs assistance Sitting-balance support: No upper extremity supported;Feet supported Sitting balance-Leahy Scale: Good     Standing balance support: Bilateral upper extremity supported;During functional activity Standing balance-Leahy Scale: Fair                             ADL either performed or assessed with clinical judgement   ADL Overall ADL's : Needs assistance/impaired Eating/Feeding: Independent;Sitting   Grooming: Min guard;Wash/dry hands;Standing Grooming Details (indicate cue type and reason): While standing at the sink Upper Body Bathing: Set up;Supervision/ safety;Sitting   Lower Body Bathing: Supervison/ safety;Min guard;Sitting/lateral leans;Sit to/from stand Lower Body Bathing Details (indicate cue type and reason): Supervision while seated, min guard while standing to complete Upper Body Dressing : Set up;Supervision/safety;Sitting   Lower Body Dressing: Moderate assistance;Sitting/lateral leans Lower Body Dressing Details (indicate cue type and reason): While seated EOB. Pt able to don/doff RLE sock with supervision, unable to don/doff LLE sock due to pain and limited ROM.  Toilet Transfer: Min guard;Ambulation;Regular Toilet;Grab bars Toilet Transfer Details (indicate cue type and reason): Cues for safety and hand placement Toileting- Clothing Manipulation and Hygiene: Min guard;Sit to/from stand       Functional mobility  during ADLs: Min guard;Rolling walker General ADL Comments:  Pt able to ambulate to/from bathroom with RW and min guard. Noted 0 instances of LOB. Pt required cues for safety.      Vision Baseline Vision/History: Wears glasses Wears Glasses: At all times       Perception     Praxis      Pertinent Vitals/Pain Pain Assessment: 0-10 Pain Score: 5  Faces Pain Scale: Hurts whole lot Pain Location: L knee Pain Descriptors / Indicators: Aching;Guarding;Grimacing Pain Intervention(s): Limited activity within patient's tolerance;Monitored during session;Repositioned     Hand Dominance Right   Extremity/Trunk Assessment Upper Extremity Assessment Upper Extremity Assessment: Overall WFL for tasks assessed   Lower Extremity Assessment Lower Extremity Assessment: Defer to PT evaluation       Communication Communication Communication: No difficulties   Cognition Arousal/Alertness: Lethargic Behavior During Therapy: Anxious Overall Cognitive Status: Within Functional Limits for tasks assessed                                 General Comments: Pt pleasant and willing to participate in therapy. Pt anxious throughout all tasks with education provided on pursed lip breathing to assist with pain management and relaxation.    General Comments  Pt's daughter present for session             Home Living Family/patient expects to be discharged to:: Private residence Living Arrangements: Spouse/significant other Available Help at Discharge: Family;Available 24 hours/day Type of Home: House Home Access: Stairs to enter [employer] of Steps: 2 Entrance Stairs-Rails: None Home Layout: One level     Bathroom Shower/Tub: Teacher, early years/pre: Standard     Home Equipment: None          Prior Functioning/Environment Level of Independence: Independent        Comments: Pt independent in all ADL, IADL, and mobility tasks. Pt does not ambulate with an assistive device and reports 0 falls in the last 6  months. 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        OT Problem List:        OT Treatment/Interventions:      OT Goals(Current goals can be found in the care plan section) Acute Rehab OT Goals Patient Stated Goal: to go home Time For Goal Achievement: 06/20/19 Potential to Achieve Goals: Good ADL Goals Pt Will Perform Grooming: with modified independence;standing Pt Will Perform Lower Body Bathing: with modified independence;sit to/from stand Pt Will Perform Lower Body Dressing: with modified independence;sit to/from stand Pt Will Transfer to Toilet: with modified independence;ambulating;regular height toilet Pt Will Perform Toileting - Clothing Manipulation and hygiene: with modified independence;sit to/from stand Pt Will Perform Tub/Shower Transfer: with min guard assist;Tub transfer;grab bars Additional ADL Goal #1: Pt to tolerate standing up to 5 min with modified independence, in preparation for ADLs. Additional ADL Goal #2: Pt to recall and verbalize 3 fall prevention strategies with 0 verbal cues.  OT Frequency:     Barriers to D/C:            Co-evaluation              AM-PAC OT "6 Clicks" Daily Activity     Outcome Measure Help from another person eating meals?: None Help from another person taking care of personal grooming?: A Little Help from another person toileting, which includes using toliet, bedpan, or  urinal?: A Little Help from another person bathing (including washing, rinsing, drying)?: A Little Help from another person to put on and taking off regular upper body clothing?: A Little Help from another person to put on and taking off regular lower body clothing?: A Lot 6 Click Score: 18   End of Session Equipment Utilized During Treatment: Gait belt;Rolling walker Nurse Communication: Mobility status  Activity Tolerance: Patient limited by pain;Patient limited by fatigue Patient left: in bed;with call bell/phone  within reach;with bed alarm set;with family/visitor present                   Time: 1515-1540 OT Time Calculation (min): 25 min Charges:  OT General Charges $OT Visit: 1 Visit OT Evaluation $OT Eval Low Complexity: 1 Low OT Treatments $Self Care/Home Management : 8-22 mins  Peterson Ao OTR/L [phone]  Peterson Ao 06/06/2019, 4:07 PM

## 2019-06-06 NOTE — Progress Notes (Signed)
Subjective: 1 Day Post-Op Procedure(s) (LRB): LEFT TOTAL KNEE ARTHROPLASTY (Left) Patient reports pain as severe.    Objective: Vital signs in last 24 hours: Temp:  [97.3 F (36.3 C)-98.6 F (37 C)] 98.6 F (37 C) (04/28 0335) Pulse Rate:  [76-100] 86 (04/28 0335) Resp:  [11-25] 17 (04/28 0335) BP: (101-192)/(68-123) 124/90 (04/28 0335) SpO2:  [91 %-100 %] 100 % (04/28 0335) Weight:  [65.3 kg] 65.3 kg (04/27 1014)  Intake/Output from previous day: 04/27 0701 - 04/28 0700 In: 1667.5 [I.V.:1317.5; IV Piggyback:350] Out: 100 [Blood:100] Intake/Output this shift: No intake/output data recorded.  Recent Labs    06/06/19 0136  HGB 11.6*   Recent Labs    06/06/19 0136  WBC 10.6*  RBC 3.73*  HCT 36.2  PLT 234   Recent Labs    06/06/19 0136  NA 138  K 3.9  CL 104  CO2 26  BUN 7  CREATININE 0.80  GLUCOSE 147*  CALCIUM 8.3*   No results for input(s): LABPT, INR in the last 72 hours.  Sensation intact distally Intact pulses distally Dorsiflexion/Plantar flexion intact Incision: dressing C/D/I Compartment soft   Assessment/Plan: 1 Day Post-Op Procedure(s) (LRB): LEFT TOTAL KNEE ARTHROPLASTY (Left) Up with therapy May not be able to discharge to home today due to pain control. Will try some Toradol     Kathryne Hitch 06/06/2019, 7:07 AM

## 2019-06-06 NOTE — Progress Notes (Addendum)
0745 Pt is in a lot of pain to left knee. Compression wrap was removed by Dr Magnus Ivan. New pain meds added and given.  0900 Pt is sleeping and verbalized of feeling better when she woke up.  0930 pt's daughter came in and telling me that pt was up all night due to pain and anxiety.  Prn meds for anxiety ordered by [facility]

## 2019-06-06 NOTE — Progress Notes (Addendum)
OT Cancellation Note  Patient Details Name: Tiffany Stout MRN: [id number] DOB: 1965-05-23   Cancelled Treatment:    Reason Eval/Treat Not Completed: Other (comment). Pt asleep in recliner upon arrival of OT. Daughter reports pt just fell asleep and requests to let pt sleep a little longer as she had a rough night last night, "panic attacks." Plan to reattempt in a bit.  Raynald Kemp, OT Acute Rehabilitation Services Pager: [phone] Office: [phone]  06/06/2019, 11:47 AM    Second attempt made at 13:30 with pt just having returned to bed and about to eat lunch.

## 2019-06-06 NOTE — Progress Notes (Signed)
Physical Therapy Treatment Patient Details Name: Tiffany Stout MRN: [id number] DOB: 1965-09-13 Today's Date: 06/06/2019    History of Present Illness Pt is a 54 y/o female s/p L TKA. PMH inclues anxiety, fibromyalgia, and HTN.     PT Comments    Pt supine in bed and initially declining but with encouragement she participated in PT session.  Pt able to progress mobility but remains to present with limited ROM.  Stressed the importance of supported knee extension to improve ROM.  Plan for stair training next session.     Follow Up Recommendations  Follow surgeon's recommendation for DC plan and follow-up therapies;Supervision for mobility/OOB     Equipment Recommendations  Rolling walker with 5" wheels;3in1 (PT)    Recommendations for Other Services       Precautions / Restrictions Precautions Precautions: Knee;Fall Precaution Booklet Issued: No Precaution Comments: Verbally reviewed knee precautions. Max VCs to rest in supported extension. Restrictions Weight Bearing Restrictions: Yes LLE Weight Bearing: Weight bearing as tolerated    Mobility  Bed Mobility Overal bed mobility: Needs Assistance Bed Mobility: Supine to Sit;Sit to Supine     Supine to sit: Supervision Sit to supine: Supervision   General bed mobility comments: Supervision for safety.  No assistance needed.  Transfers Overall transfer level: Needs assistance Equipment used: Rolling walker (2 wheeled) Transfers: Sit to/from Stand Sit to Stand: Supervision         General transfer comment: Cues for hand placement and weight bearing in B LEs. Also performed from commode.  Ambulation/Gait Ambulation/Gait assistance: Min guard Gait Distance (Feet): 110 Feet Assistive device: Rolling walker (2 wheeled) Gait Pattern/deviations: Step-to pattern;Antalgic;Trunk flexed;Decreased stride length;Decreased dorsiflexion - left     General Gait Details: Cues for L heel strike and gt  symmetry.  Pt very  anxious and cautious.  Became dizzy during session.  Attempted to progress to step through pattern but unable to tolerate.   Stairs             Wheelchair Mobility    Modified Rankin (Stroke Patients Only)       Balance Overall balance assessment: Needs assistance Sitting-balance support: No upper extremity supported;Feet supported Sitting balance-Leahy Scale: Fair     Standing balance support: Bilateral upper extremity supported;During functional activity Standing balance-Leahy Scale: Poor                              Cognition Arousal/Alertness: Awake/alert Behavior During Therapy: Anxious Overall Cognitive Status: Within Functional Limits for tasks assessed                                 General Comments: Pt anxious throughout mobility; most likely secondary to pain levels.       Exercises Total Joint Exercises Ankle Circles/Pumps: AROM;Both;Supine;20 reps Quad Sets: AROM;Left;10 reps;Supine Heel Slides: AAROM;Left;10 reps;Supine Hip ABduction/ADduction: AROM;Left;10 reps;Supine Straight Leg Raises: AAROM;Left;10 reps;Supine Goniometric ROM: 15-60 deg L knee    General Comments        Pertinent Vitals/Pain Pain Assessment: Faces Faces Pain Scale: Hurts whole lot Pain Location: L knee Pain Descriptors / Indicators: Aching;Guarding;Grimacing Pain Intervention(s): Monitored during session;Repositioned    Home Living                      Prior Function            PT Goals (current  goals can now be found in the care plan section) Acute Rehab PT Goals Patient Stated Goal: to go home Potential to Achieve Goals: Good Progress towards PT goals: Progressing toward goals    Frequency    7X/week      PT Plan Current plan remains appropriate    Co-evaluation              AM-PAC PT "6 Clicks" Mobility   Outcome Measure  Help needed turning from your back to your side while in a flat bed without using  bedrails?: A Little Help needed moving from lying on your back to sitting on the side of a flat bed without using bedrails?: A Little Help needed moving to and from a bed to a chair (including a wheelchair)?: A Little Help needed standing up from a chair using your arms (e.g., wheelchair or bedside chair)?: A Little Help needed to walk in hospital room?: A Little Help needed climbing 3-5 steps with a railing? : A Little 6 Click Score: 18    End of Session Equipment Utilized During Treatment: Gait belt Activity Tolerance: Patient limited by pain   Nurse Communication: Mobility status PT Visit Diagnosis: Unsteadiness on feet (R26.81);Muscle weakness (generalized) (M62.81)     Time: 2671-2458 PT Time Calculation (min) (ACUTE ONLY): 26 min  Charges:  $Gait Training: 8-22 mins $Therapeutic Exercise: 8-22 mins                    Erasmo Leventhal , PTA Acute Rehabilitation Services Pager [phone] Office [phone]     Markies Mowatt Eli Hose 06/06/2019, 2:55 PM

## 2019-06-07 ENCOUNTER — Observation Stay (HOSPITAL_BASED_OUTPATIENT_CLINIC_OR_DEPARTMENT_OTHER): Payer: PRIVATE HEALTH INSURANCE

## 2019-06-07 ENCOUNTER — Other Ambulatory Visit: Payer: Self-pay | Admitting: Physician Assistant

## 2019-06-07 DIAGNOSIS — M7989 Other specified soft tissue disorders: Secondary | ICD-10-CM | POA: Diagnosis not present

## 2019-06-07 DIAGNOSIS — M79662 Pain in left lower leg: Secondary | ICD-10-CM | POA: Diagnosis not present

## 2019-06-07 DIAGNOSIS — M79609 Pain in unspecified limb: Secondary | ICD-10-CM | POA: Diagnosis not present

## 2019-06-07 DIAGNOSIS — Z96652 Presence of left artificial knee joint: Secondary | ICD-10-CM

## 2019-06-07 DIAGNOSIS — R609 Edema, unspecified: Secondary | ICD-10-CM

## 2019-06-07 DIAGNOSIS — M170 Bilateral primary osteoarthritis of knee: Secondary | ICD-10-CM | POA: Diagnosis not present

## 2019-06-07 MED ORDER — METHOCARBAMOL 500 MG PO TABS: 500.0000 mg | ORAL_TABLET | Freq: Four times a day (QID) | ORAL | 1 refills | Status: DC | PRN

## 2019-06-07 MED ORDER — OXYCODONE HCL 5 MG PO TABS: 5.0000 mg | ORAL_TABLET | ORAL | 0 refills | Status: DC | PRN

## 2019-06-07 MED ORDER — ASPIRIN 325 MG PO TBEC: 325.0000 mg | DELAYED_RELEASE_TABLET | Freq: Two times a day (BID) | ORAL | 0 refills | Status: DC

## 2019-06-07 NOTE — Progress Notes (Signed)
Occupational Therapy Progress Note  Pt ambulated to ortho gym with min guard assist.  She was instructed how safely perform tub transfer using 3in1 commode and was able to return demonstration safely with min guard assist.  She was instructed how to reduce fall risk at home.     06/07/19 1253  OT Visit Information  Last OT Received On 06/07/19  Assistance Needed +1  History of Present Illness Pt is a 54 y/o female s/p L TKA. PMH inclues anxiety, fibromyalgia, and HTN.   Precautions  Precautions Knee;Fall  Precaution Booklet Issued No  Pain Assessment  Pain Assessment No/denies pain  Faces Pain Scale 4  Pain Location L knee  Pain Descriptors / Indicators Aching;Guarding;Grimacing  Pain Intervention(s) Monitored during session  Cognition  Arousal/Alertness Awake/alert  Behavior During Therapy WFL for tasks assessed/performed  Overall Cognitive Status Within Functional Limits for tasks assessed  Upper Extremity Assessment  Upper Extremity Assessment Overall WFL for tasks assessed  Lower Extremity Assessment  Lower Extremity Assessment Defer to PT evaluation  ADL  Overall ADL's  Needs assistance/impaired  Tub/ Shower Transfer Tub transfer;3 in 1;Ambulation;Rolling walker  Tub/Shower Transfer Details (indicate cue type and reason) Pt instructed how to perform tub transfer using 3in1 commode.  She was able to return demonstration with min guard assist   Functional mobility during ADLs Min guard  General ADL Comments Pt instructed to remove area rugs, to use counters to move items rather than attempting to carry items whle using the RW, and instructed in use of walker bag   Bed Mobility  Overal bed mobility Needs Assistance  Bed Mobility Supine to Sit;Sit to Supine  Supine to sit Min guard  Sit to supine Min assist  General bed mobility comments assist for Lt LE   Balance  Overall balance assessment Needs assistance  Standing balance support Single extremity supported;During  functional activity  Standing balance-Leahy Scale Fair  Standing balance comment able to maintain static standing with min guard assist   Restrictions  Weight Bearing Restrictions Yes  LLE Weight Bearing WBAT  Transfers  Overall transfer level Needs assistance  Equipment used Rolling walker (2 wheeled)  Transfers Sit to/from Stand  Sit to Stand Min guard  Stand pivot transfers Min guard  General Comments  General comments (skin integrity, edema, etc.) Pt reports she is very eager to regain independence and return to work   OT - End of Pension scheme manager During Treatment Gait belt;Rolling walker  Activity Tolerance Patient tolerated treatment well  Patient left in bed;with call bell/phone within reach (ultrasound tech in the room )  OT Assessment/Plan  OT Plan Discharge plan remains appropriate;Equipment recommendations need to be updated  OT Visit Diagnosis Pain  Pain - Right/Left Left  Pain - part of body Knee  OT Frequency (ACUTE ONLY) Min 2X/week  Follow Up Recommendations Home health OT;Supervision - Intermittent  OT Equipment 3 in 1 bedside commode  AM-PAC OT "6 Clicks" Daily Activity Outcome Measure (Version 2)  Help from another person eating meals? 4  Help from another person taking care of personal grooming? 3  Help from another person toileting, which includes using toliet, bedpan, or urinal? 3  Help from another person bathing (including washing, rinsing, drying)? 3  Help from another person to put on and taking off regular upper body clothing? 3  Help from another person to put on and taking off regular lower body clothing? 2  6 Click Score 18  OT Goal Progression  Progress  towards OT goals Progressing toward goals  OT Time Calculation  OT Start Time (ACUTE ONLY) 1111  OT Stop Time (ACUTE ONLY) 1128  OT Time Calculation (min) 17 min  OT General Charges  $OT Visit 1 Visit  OT Treatments  $Self Care/Home Management  8-22 mins  Nilsa Nutting., OTR/L Acute  Rehabilitation Services Pager [phone] Office [phone]

## 2019-06-07 NOTE — Progress Notes (Signed)
Physical Therapy Treatment Patient Details Name: Tiffany Stout MRN: [id number] DOB: May 15, 1965 Today's Date: 06/07/2019    History of Present Illness Pt is a 54 y/o female s/p L TKA. PMH inclues anxiety, fibromyalgia, and HTN.     PT Comments    Pt progressing steadily towards her physical therapy goals. Ambulating 100 feet with a walker at supervision level. Unable to progress to step through pattern quite yet (more limited by anxious behaviors than actual ability). Able to negotiate 2 steps with a walker to simulate home set up. Recommended assist for technique and provided written handout with visualizations and instructions. Pt still only achieving 60 degrees knee flexion with over pressure and will continue to need aggressive ROM.     Follow Up Recommendations  Follow surgeon's recommendation for DC plan and follow-up therapies;Supervision for mobility/OOB     Equipment Recommendations  Rolling walker with 5" wheels;3in1 (PT)    Recommendations for Other Services       Precautions / Restrictions Precautions Precautions: Knee;Fall Precaution Booklet Issued: No Restrictions Weight Bearing Restrictions: Yes LLE Weight Bearing: Weight bearing as tolerated    Mobility  Bed Mobility Overal bed mobility: Needs Assistance Bed Mobility: Supine to Sit;Sit to Supine     Supine to sit: Min guard Sit to supine: Min assist   General bed mobility comments: OOB in chair  Transfers Overall transfer level: Needs assistance Equipment used: Rolling walker (2 wheeled) Transfers: Sit to/from Stand Sit to Stand: Supervision Stand pivot transfers: Min guard       General transfer comment: verbal cues for sequencinb   Ambulation/Gait Ambulation/Gait assistance: Supervision Gait Distance (Feet): 100 Feet Assistive device: Rolling walker (2 wheeled) Gait Pattern/deviations: Step-to pattern;Antalgic;Trunk flexed;Decreased stride length;Decreased dorsiflexion - left Gait velocity:  decreased Gait velocity interpretation: <1.31 ft/sec, indicative of household ambulator General Gait Details: Supervision for safety. Visual/verbal cues to progress to step through pattern but pt unable to maintain.    Stairs Stairs: Yes Stairs assistance: Min assist Stair Management: With walker;Backwards;Forwards Number of Stairs: 2 General stair comments: Visual/verbal cues for instruction negotiating steps with walker, ascending backwards and descending forwards. MinA to stabilize. Cues for sequencing.   Wheelchair Mobility    Modified Rankin (Stroke Patients Only)       Balance Overall balance assessment: Needs assistance Sitting-balance support: No upper extremity supported;Feet supported Sitting balance-Leahy Scale: Good     Standing balance support: During functional activity;No upper extremity supported Standing balance-Leahy Scale: Fair Standing balance comment: able to maintain static standing with min guard assist                             Cognition Arousal/Alertness: Awake/alert Behavior During Therapy: Anxious Overall Cognitive Status: Within Functional Limits for tasks assessed                                        Exercises Total Joint Exercises Quad Sets: Both;10 reps;Seated Heel Slides: Left;5 reps;Seated Long Arc Quad: Left;10 reps;Seated    General Comments General comments (skin integrity, edema, etc.): Pt reports she is very eager to regain independence and return to work       Pertinent Vitals/Pain Pain Assessment: Faces Faces Pain Scale: Hurts whole lot Pain Location: L knee with attempts at flexion Pain Descriptors / Indicators: Aching;Guarding;Grimacing Pain Intervention(s): Limited activity within patient's tolerance;Monitored during session  Home Living                      Prior Function            PT Goals (current goals can now be found in the care plan section) Acute Rehab PT  Goals Patient Stated Goal: to go home Potential to Achieve Goals: Good Progress towards PT goals: Progressing toward goals    Frequency    7X/week      PT Plan Current plan remains appropriate    Co-evaluation              AM-PAC PT "6 Clicks" Mobility   Outcome Measure  Help needed turning from your back to your side while in a flat bed without using bedrails?: A Little Help needed moving from lying on your back to sitting on the side of a flat bed without using bedrails?: A Little Help needed moving to and from a bed to a chair (including a wheelchair)?: None Help needed standing up from a chair using your arms (e.g., wheelchair or bedside chair)?: None Help needed to walk in hospital room?: None Help needed climbing 3-5 steps with a railing? : A Little 6 Click Score: 21    End of Session Equipment Utilized During Treatment: Gait belt Activity Tolerance: Patient tolerated treatment well Patient left: in chair;with call bell/phone within reach;with chair alarm set Nurse Communication: Mobility status PT Visit Diagnosis: Unsteadiness on feet (R26.81);Muscle weakness (generalized) (M62.81)     Time: 6761-9509 PT Time Calculation (min) (ACUTE ONLY): 28 min  Charges:  $Gait Training: 23-37 mins                       Wyona Almas, [place], DPT Acute Rehabilitation Services Pager [phone] Office [phone]    Deno Etienne 06/07/2019, 2:43 PM

## 2019-06-07 NOTE — Discharge Summary (Signed)
Patient ID: Tiffany Stout MRN: [id number] DOB/AGE: [birth year] 54 y.o.  Admit date: 06/05/2019 Discharge date: 06/07/2019  Admission Diagnoses:  Principal Problem:   Unilateral primary osteoarthritis, left knee Active Problems:   Status post total left knee replacement   Discharge Diagnoses:  S/P left total knee arthroplasty Calf pain post-op  Past Medical History:  Diagnosis Date  . Anxiety   . Arthritis   . Fibromyalgia   . GERD (gastroesophageal reflux disease)   . Hypertension   . IBS (irritable bowel syndrome)     Surgeries: Procedure(s): LEFT TOTAL KNEE ARTHROPLASTY on 06/05/2019   Consultants:   Discharged Condition: Improved  Hospital Course: Tiffany Stout is an 54 y.o. female who was admitted 06/05/2019 for operative treatment ofUnilateral primary osteoarthritis, left knee. Patient has severe unremitting pain that affects sleep, daily activities, and work/hobbies. After pre-op clearance the patient was taken to the operating room on 06/05/2019 and underwent  Procedure(s): LEFT TOTAL KNEE ARTHROPLASTY.    Patient was given perioperative antibiotics:  Anti-infectives (From admission, onward)   Start     Dose/Rate Route Frequency Ordered Stop   06/05/19 1830  ceFAZolin (ANCEF) IVPB 1 g/50 mL premix     1 g 100 mL/hr over 30 Minutes Intravenous Every 6 hours 06/05/19 1654 06/06/19 0036   06/05/19 1145  ceFAZolin (ANCEF) IVPB 2g/100 mL premix     2 g 200 mL/hr over 30 Minutes Intravenous On call to O.R. 06/05/19 1022 06/05/19 1300   06/05/19 1033  ceFAZolin (ANCEF) 2-4 GM/100ML-% IVPB    Note to Pharmacy: Ernie Avena   : cabinet override      06/05/19 1033 06/05/19 1236       Patient was given sequential compression devices, early ambulation, and chemoprophylaxis to prevent DVT. Underwent doppler for calf pain and swelling . Doppler was negative for DVT.  Patient benefited maximally from hospital stay and there were no complications.    Recent vital signs:   Patient Vitals for the past 24 hrs:  BP Temp Temp src Pulse Resp SpO2  06/07/19 0838 128/82 98.4 F (36.9 C) Oral 95 17 99 %  06/07/19 0305 120/74 98.3 F (36.8 C) Oral 88 16 94 %  06/06/19 2030 118/71 98.3 F (36.8 C) Oral (!) 106 16 97 %  06/06/19 1328 139/79 98.3 F (36.8 C) Oral 87 16 99 %  06/06/19 1023 122/76 -- -- 85 17 97 %     Recent laboratory studies:  Recent Labs    06/06/19 0136  WBC 10.6*  HGB 11.6*  HCT 36.2  PLT 234  NA 138  K 3.9  CL 104  CO2 26  BUN 7  CREATININE 0.80  GLUCOSE 147*  CALCIUM 8.3*     Discharge Medications:     Diagnostic Studies: DG Knee Left Port  Result Date: 06/05/2019 CLINICAL DATA:  Follow-up total knee replacement EXAM: PORTABLE LEFT KNEE - 1-2 VIEW COMPARISON:  None FINDINGS: Two-view show total knee arthroplasty. Components appear well positioned. No radiographically detectable complication. IMPRESSION: Good appearance following total knee arthroplasty. Electronically Signed   By: Paulina Fusi M.D.   On: 06/05/2019 15:00    Disposition: Discharge disposition: 01-Home or Self Care         Follow-up Information    Kathryne Hitch, MD. Schedule an appointment as soon as possible for a visit in 2 week(s).   Specialty: Orthopedic Surgery Contact information: [address] [place] [address] [phone]  SignedErskine Emery 06/07/2019, 9:41 AM

## 2019-06-07 NOTE — Progress Notes (Signed)
Subjective: 2 Days Post-Op Procedure(s) (LRB): LEFT TOTAL KNEE ARTHROPLASTY (Left) Patient reports pain as moderate.  Better pain control today advancing well with PT. Complains of left calf pain  No SOB or chest pain.   Objective: Vital signs in last 24 hours: Temp:  [98.3 F (36.8 C)-98.4 F (36.9 C)] 98.4 F (36.9 C) (04/29 0838) Pulse Rate:  [85-106] 95 (04/29 0838) Resp:  [16-17] 17 (04/29 0838) BP: (118-139)/(71-82) 128/82 (04/29 0838) SpO2:  [94 %-99 %] 99 % (04/29 0838)  Intake/Output from previous day: 04/28 0701 - 04/29 0700 In: 720 [P.O.:720] Out: -  Intake/Output this shift: Total I/O In: 1668.4 [I.V.:1668.4] Out: -   Recent Labs    06/06/19 0136  HGB 11.6*   Recent Labs    06/06/19 0136  WBC 10.6*  RBC 3.73*  HCT 36.2  PLT 234   Recent Labs    06/06/19 0136  NA 138  K 3.9  CL 104  CO2 26  BUN 7  CREATININE 0.80  GLUCOSE 147*  CALCIUM 8.3*   No results for input(s): LABPT, INR in the last 72 hours.  Left lower extremity: Intact pulses distally Dorsiflexion/Plantar flexion intact Incision: dressing C/D/I Compartment soft Left calf severe tenderness  Assessment/Plan: 2 Days Post-Op Procedure(s) (LRB): LEFT TOTAL KNEE ARTHROPLASTY (Left) Up with therapy  Doppler to rule out DVT left lower extremity Plan to discharge later today if patient remains stable and appropriate for discharge from PT standpoint. m       Richardean Canal 06/07/2019, 9:33 AM

## 2019-06-07 NOTE — Progress Notes (Signed)
Lower extremity venous has been completed.   Preliminary results in CV Proc.   Blanch Media 06/07/2019 11:40 AM

## 2019-06-07 NOTE — Plan of Care (Signed)

## 2019-06-07 NOTE — Progress Notes (Signed)
Occupational Therapy Treatment Patient Details Name: Tiffany Stout MRN: [id number] DOB: 1965/06/29 Today's Date: 06/07/2019    History of present illness Pt is a 54 y/o female s/p L TKA. PMH inclues anxiety, fibromyalgia, and HTN.    OT comments  Pt very eager to work with OT and increase independence.  She requires mod A for LB ADLs as she requires assist to don/doff socks and shoes.  She is able to don pants with min guard assist.  Session terminated early due to arrival of ultrasound tech.    Follow Up Recommendations  Home health OT;Supervision - Intermittent    Equipment Recommendations  3 in 1 bedside commode    Recommendations for Other Services      Precautions / Restrictions Precautions Precautions: Knee;Fall Precaution Booklet Issued: No Restrictions Weight Bearing Restrictions: Yes LLE Weight Bearing: Weight bearing as tolerated       Mobility Bed Mobility Overal bed mobility: Needs Assistance Bed Mobility: Supine to Sit;Sit to Supine     Supine to sit: Min guard Sit to supine: Min assist   General bed mobility comments: assist for Lt LE   Transfers Overall transfer level: Needs assistance Equipment used: Rolling walker (2 wheeled) Transfers: Sit to/from Stand Sit to Stand: Min guard         General transfer comment: verbal cues for sequencinb     Balance Overall balance assessment: Needs assistance         Standing balance support: Single extremity supported;During functional activity Standing balance-Leahy Scale: Fair Standing balance comment: able to maintain static standing with min guard assist                            ADL either performed or assessed with clinical judgement   ADL Overall ADL's : Needs assistance/impaired                     Lower Body Dressing: Moderate assistance;Sit to/from stand Lower Body Dressing Details (indicate cue type and reason): Pt is able to access Lt ankle while seated EOB, but  unable to don or doff socks.  She is able to don and doff pants with min guard assist              Functional mobility during ADLs: Min guard;Rolling walker General ADL Comments: Pt reports she will wear flip flops at home.  Discussed with her the recommendation for a solid shoe with a back to reduce risk of falls.  She verbalized understanding      Vision       Perception     Praxis      Cognition Arousal/Alertness: Awake/alert Behavior During Therapy: WFL for tasks assessed/performed Overall Cognitive Status: Within Functional Limits for tasks assessed                                          Exercises     Shoulder Instructions       General Comments Pt reports her daughter  or spouse will assist her with LB ADLs initially until she is able to perform on her own.  Encouraged her to attempt to perform LB ADLs daily to improve Lt knee ROM and flexibility     Pertinent Vitals/ Pain       Pain Assessment: Faces Faces Pain Scale: Hurts little more Pain Location:  L knee Pain Descriptors / Indicators: Aching;Guarding;Grimacing Pain Intervention(s): Monitored during session  Home Living                                          Prior Functioning/Environment              Frequency  Min 2X/week        Progress Toward Goals  OT Goals(current goals can now be found in the care plan section)  Progress towards OT goals: Progressing toward goals     Plan Discharge plan remains appropriate;Equipment recommendations need to be updated    Co-evaluation                 AM-PAC OT "6 Clicks" Daily Activity     Outcome Measure   Help from another person eating meals?: None Help from another person taking care of personal grooming?: A Little Help from another person toileting, which includes using toliet, bedpan, or urinal?: A Little Help from another person bathing (including washing, rinsing, drying)?: A Little Help from  another person to put on and taking off regular upper body clothing?: A Little Help from another person to put on and taking off regular lower body clothing?: A Lot 6 Click Score: 18    End of Session Equipment Utilized During Treatment: Gait belt;Rolling walker CPM Left Knee CPM Left Knee: Off  OT Visit Diagnosis: Pain Pain - Right/Left: Left Pain - part of body: Knee   Activity Tolerance Patient tolerated treatment well   Patient Left in bed;with call bell/phone within reach(ultrasound tech in the room )   Nurse Communication          Time: 1048-1100 OT Time Calculation (min): 12 min  Charges: OT General Charges $OT Visit: 1 Visit OT Treatments $Self Care/Home Management : 8-22 mins  Eber Jones., OTR/L Acute Rehabilitation Services Pager [phone] Office [phone]    Jeani Hawking M 06/07/2019, 12:52 PM

## 2019-06-07 NOTE — Progress Notes (Addendum)
Provided discharge education/instructions, all questions and concerns addressed, Pt not in distress, BSC and RW delivered to room, discharged home with belongings accompanied by husband.  Daughter, Karoline Caldwell, updated regarding discharge.

## 2019-06-07 NOTE — Progress Notes (Addendum)
Physical Therapy Treatment Patient Details Name: Tiffany Stout MRN: [id number] DOB: May 06, 1965 Today's Date: 06/07/2019    History of Present Illness Pt is a 54 y/o female s/p L TKA. PMH inclues anxiety, fibromyalgia, and HTN.     PT Comments    Pt making excellent progress towards her physical therapy goals this session, ambulating 200 feet with a walker and able to progress to step through pattern with cues. Performed 4 sets of ascending/descending steps (8 total); able to perform with no cues on last 2 trials. Will continue to need aggressive ROM for knee flexion as she is only achieving about 60 degrees with over pressure.    Follow Up Recommendations  Supervision for mobility/OOB;Outpatient PT     Equipment Recommendations  Rolling walker with 5" wheels;3in1 (PT)    Recommendations for Other Services       Precautions / Restrictions Precautions Precautions: Knee;Fall Precaution Booklet Issued: No Restrictions Weight Bearing Restrictions: Yes LLE Weight Bearing: Weight bearing as tolerated    Mobility  Bed Mobility Overal bed mobility: Modified Independent             General bed mobility comments: OOB in chair  Transfers Overall transfer level: Modified independent Equipment used: Rolling walker (2 wheeled) Transfers: Sit to/from Stand Sit to Stand: Supervision            Ambulation/Gait Ambulation/Gait assistance: Supervision Gait Distance (Feet): 200 Feet Assistive device: Rolling walker (2 wheeled) Gait Pattern/deviations: Antalgic;Trunk flexed;Decreased stride length;Decreased dorsiflexion - left;Step-to pattern;Step-through pattern Gait velocity: decreased Gait velocity interpretation: <1.31 ft/sec, indicative of household ambulator General Gait Details: Cues for step through pattern and L heel strike at initial contact. Improved speed   Stairs Stairs: Yes Stairs assistance: Supervision;Min guard Stair Management: With  walker;Backwards;Forwards Number of Stairs: 8 General stair comments: Visual/verbal cues for instruction negotiating steps with walker, ascending backwards and descending forwards. Cues for sequencing.   Wheelchair Mobility    Modified Rankin (Stroke Patients Only)       Balance Overall balance assessment: Needs assistance Sitting-balance support: No upper extremity supported;Feet supported Sitting balance-Leahy Scale: Good     Standing balance support: During functional activity;No upper extremity supported Standing balance-Leahy Scale: Fair                              Cognition Arousal/Alertness: Awake/alert Behavior During Therapy: WFL for tasks assessed/performed Overall Cognitive Status: Within Functional Limits for tasks assessed                                        Exercises Total Joint Exercises Quad Sets: Both;10 reps;Seated Heel Slides: Left;5 reps;Seated Long Arc Quad: Left;10 reps;Seated    General Comments        Pertinent Vitals/Pain Pain Assessment: Faces Faces Pain Scale: Hurts little more Pain Location: L knee Pain Descriptors / Indicators: Aching;Guarding;Grimacing Pain Intervention(s): Monitored during session    Home Living                      Prior Function            PT Goals (current goals can now be found in the care plan section) Acute Rehab PT Goals Patient Stated Goal: to go home Potential to Achieve Goals: Good Progress towards PT goals: Progressing toward goals    Frequency  7X/week      PT Plan Current plan remains appropriate    Co-evaluation              AM-PAC PT "6 Clicks" Mobility   Outcome Measure  Help needed turning from your back to your side while in a flat bed without using bedrails?: A Little Help needed moving from lying on your back to sitting on the side of a flat bed without using bedrails?: A Little Help needed moving to and from a bed to a chair  (including a wheelchair)?: None Help needed standing up from a chair using your arms (e.g., wheelchair or bedside chair)?: None Help needed to walk in hospital room?: None Help needed climbing 3-5 steps with a railing? : A Little 6 Click Score: 21    End of Session Equipment Utilized During Treatment: Gait belt Activity Tolerance: Patient tolerated treatment well Patient left: with call bell/phone within reach;in bed;with bed alarm set Nurse Communication: Mobility status PT Visit Diagnosis: Unsteadiness on feet (R26.81);Muscle weakness (generalized) (M62.81)     Time: 4270-6237 PT Time Calculation (min) (ACUTE ONLY): 19 min  Charges:  $Gait Training: 8-22 mins                       Lillia Pauls, PT, DPT Acute Rehabilitation Services Pager [phone] Office [phone]    Norval Morton 06/07/2019, 5:39 PM

## 2019-06-08 ENCOUNTER — Telehealth: Payer: Self-pay | Admitting: Orthopaedic Surgery

## 2019-06-08 MED ORDER — HYDROMORPHONE HCL 2 MG PO TABS: 2.0000 mg | ORAL_TABLET | ORAL | 0 refills | Status: DC | PRN

## 2019-06-08 NOTE — Telephone Encounter (Signed)
Patient's daughter would like a call back.  The patient had a really bad night with being in a lot of pain.  CB#[phone].  Thank you.

## 2019-06-08 NOTE — Telephone Encounter (Signed)
Patient in a lot of pain, daughter wants to talk to you or Magnus Ivan

## 2019-06-11 ENCOUNTER — Telehealth: Payer: Self-pay | Admitting: Orthopaedic Surgery

## 2019-06-11 NOTE — Telephone Encounter (Signed)
Tiffany Stout from [facility] called. She would like verbal orders for home PT 1X wk 1 wk and 3x wk  2wk

## 2019-06-11 NOTE — Telephone Encounter (Signed)
Verbal order given  

## 2019-06-18 ENCOUNTER — Telehealth: Payer: Self-pay | Admitting: Orthopaedic Surgery
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
## 2019-06-18 NOTE — Telephone Encounter (Signed)
Patient called advised she is out of her pain medicine and muscle relaxer. Patient said  she's in a lot of pain. Patient asked if the Rx can be filled as soon as possible. Patient said she was afraid to call for a refill on her pain medicine didn't want anyone to think bad of her..   Patient said she couldn't sleep last night.  Patient said she was afraid to take anything over the counter because she did not know what to take.  The number to contact patient is [phone]

## 2019-06-18 NOTE — Telephone Encounter (Signed)
Patient's daughter called to check on the status of the patient's refill request.  She stated that the patient (her mother) is out of the pain medication and had a hard time sleeping last night.  She also wanted to know if the RX refill will be sent in today.   Thank you.

## 2019-06-18 NOTE — Telephone Encounter (Signed)
Please advise 

## 2019-06-18 NOTE — Telephone Encounter (Signed)
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  She needs to be seen so I can show her the actual images and explain using a model what this involves and what is going on with her shoulder because she may end up needing a repeat arthroscopy due to the buildup of scar tissue and the pain she is having.

## 2019-06-19 ENCOUNTER — Other Ambulatory Visit: Payer: Self-pay | Admitting: Orthopaedic Surgery

## 2019-06-19 ENCOUNTER — Ambulatory Visit (INDEPENDENT_AMBULATORY_CARE_PROVIDER_SITE_OTHER): Payer: No Typology Code available for payment source | Admitting: Orthopaedic Surgery

## 2019-06-19 ENCOUNTER — Other Ambulatory Visit: Payer: Self-pay

## 2019-06-19 ENCOUNTER — Encounter: Payer: Self-pay | Admitting: Orthopaedic Surgery

## 2019-06-19 DIAGNOSIS — Z96652 Presence of left artificial knee joint: Secondary | ICD-10-CM

## 2019-06-19 MED ORDER — IBUPROFEN 800 MG PO TABS: 800.0000 mg | ORAL_TABLET | Freq: Three times a day (TID) | ORAL | 1 refills | Status: DC | PRN

## 2019-06-19 MED ORDER — TIZANIDINE HCL 4 MG PO TABS
4.0000 mg | ORAL_TABLET | Freq: Three times a day (TID) | ORAL | 1 refills | Status: DC | PRN
Start: 2019-06-19 — End: 2019-12-17

## 2019-06-19 MED ORDER — HYDROMORPHONE HCL 2 MG PO TABS: 2.0000 mg | ORAL_TABLET | ORAL | 0 refills | Status: DC | PRN

## 2019-06-19 NOTE — Progress Notes (Signed)
The patient is 2 weeks today status post a left total knee arthroplasty.  She is really struggling with pain.  Outpatient physical therapy does work with her some but has not been able to push her hard.  She is also dealing with anxiety from the pain and surgery in general.  She is only 54 years old.  I reassured her that this is a very painful surgery for someone so young and that we will do everything we can on our part to get her through this.  On exam her calf is soft on the left side.  Her staple line looks good so remove the staples in place Steri-Strips.  She has been on aspirin twice a day which she can stop.  I will send in more Dilaudid as well as Zanaflex and 800 mg ibuprofen.  I gave her a prescription for outpatient physical therapy at [facility] physical therapy.  All questions and concerns were answered and addressed.  We will see her back in 4 weeks to see how she is doing from a therapy standpoint but no x-rays are needed.

## 2019-06-20 ENCOUNTER — Telehealth: Payer: Self-pay | Admitting: Orthopaedic Surgery

## 2019-06-20 NOTE — Telephone Encounter (Signed)
Noted  

## 2019-06-20 NOTE — Telephone Encounter (Signed)
Received call from Clare Gandy (PT) with [facility] at [facility]. Tom advised he went tout to see patient today and daughter met him at the car advised patient did not want to do (PT) today. Gershon Mussel said will list as missed visit today.  The number to contact Clare Gandy is [phone]

## 2019-06-26 ENCOUNTER — Telehealth: Payer: Self-pay

## 2019-06-26 NOTE — Telephone Encounter (Signed)
Dr. Lissa Hoard at [facility] would like a call back from Dr. Magnus Ivan concerning medication.(Dilaudid).  Cb# [phone].  Please advise.  Thank you.

## 2019-06-26 NOTE — Telephone Encounter (Signed)
Please advise 

## 2019-06-28 ENCOUNTER — Telehealth: Payer: Self-pay | Admitting: Orthopaedic Surgery

## 2019-06-28 MED ORDER — OXYCODONE HCL 5 MG PO TABS: 5.0000 mg | ORAL_TABLET | Freq: Four times a day (QID) | ORAL | 0 refills | Status: DC | PRN

## 2019-06-28 NOTE — Telephone Encounter (Signed)
I did send in some oxycodone.

## 2019-06-28 NOTE — Telephone Encounter (Signed)
Patient's daughter Karoline Caldwell) called stating that they discussed the patient's sleeping with the PCP.  The daughter advised that the patient's PCP reached out to Dr. Magnus Ivan to discuss changing her medication.  The daughter stated that if Dr. Magnus Ivan would change her pain medication from the Hydromorphone to the Oxycodone, then the PCP could prescribe the patient sleep medication, which is what the patient is wanting to do.  Daughter's CB#[phone].  Thank you.

## 2019-06-29 ENCOUNTER — Telehealth: Payer: Self-pay | Admitting: Orthopaedic Surgery

## 2019-06-29 NOTE — Telephone Encounter (Signed)
Patient aware we called in oxycodone

## 2019-06-29 NOTE — Telephone Encounter (Signed)
Patient called.   She is currently experiencing a lot of issues with her current medication; including lack of sleep. She is requesting a call back to discuss an alternative medication.  Call back: [phone]

## 2019-07-17 ENCOUNTER — Ambulatory Visit (INDEPENDENT_AMBULATORY_CARE_PROVIDER_SITE_OTHER): Payer: No Typology Code available for payment source | Admitting: Orthopaedic Surgery

## 2019-07-17 ENCOUNTER — Other Ambulatory Visit: Payer: Self-pay

## 2019-07-17 ENCOUNTER — Encounter: Payer: Self-pay | Admitting: Orthopaedic Surgery

## 2019-07-17 DIAGNOSIS — Z96652 Presence of left artificial knee joint: Secondary | ICD-10-CM

## 2019-07-17 NOTE — Progress Notes (Signed)
The patient comes in today at about 6 weeks status post a left total knee arthroplasty.  She is already off of pain medications and is to push herself very hard.  The notes from physical therapy also state that she is doing excellent.  On my exam today she lacks full extension by just a few degrees and I can flex her to about 105 degrees.  The notes from therapy stated they have been able to flex her to 115 degrees.  She is very motivated and is doing excellent.  I gave her reassurance that now she has had what most people are.  She does have appropriate knee swelling still postoperative.  This point we will see her back for final visit in 4 weeks.  She can return to full work activities starting then.  No x-rays are needed at the next visit.  If she looks good at that visit we will then see her back about 6 months later.

## 2019-08-09 ENCOUNTER — Ambulatory Visit: Payer: No Typology Code available for payment source | Attending: Internal Medicine

## 2019-08-09 DIAGNOSIS — Z23 Encounter for immunization: Secondary | ICD-10-CM

## 2019-08-09 NOTE — Progress Notes (Signed)
   Covid-19 Vaccination Clinic  Name:  Tiffany Stout    MRN: [id number] DOB: Apr 06, 1965  08/09/2019  [name] was observed post Covid-19 immunization for 15 minutes without incident. She was provided with Vaccine Information Sheet and instruction to access the V-Safe system.   [name] was instructed to call 911 with any severe reactions post vaccine: Marland Kitchen Difficulty breathing  . Swelling of face and throat  . A fast heartbeat  . A bad rash all over body  . Dizziness and weakness   Immunizations Administered    Name Date Dose VIS Date Route   Moderna COVID-19 Vaccine 08/09/2019 10:05 AM 0.5 mL 01/2019 Intramuscular   Manufacturer: Moderna   Lot: [id number]   NDC: 48185-631-49

## 2019-08-14 ENCOUNTER — Encounter: Payer: Self-pay | Admitting: Orthopaedic Surgery

## 2019-08-14 ENCOUNTER — Ambulatory Visit (INDEPENDENT_AMBULATORY_CARE_PROVIDER_SITE_OTHER): Payer: No Typology Code available for payment source | Admitting: Orthopaedic Surgery

## 2019-08-14 ENCOUNTER — Other Ambulatory Visit: Payer: Self-pay

## 2019-08-14 DIAGNOSIS — Z96652 Presence of left artificial knee joint: Secondary | ICD-10-CM

## 2019-08-14 NOTE — Progress Notes (Signed)
The patient comes in today now about 10 weeks status post a left total knee arthroplasty.  She is doing well overall.  She is participating well in outpatient physical therapy and is overall pleased.  On examination of her left knee she lacks full extension by about 3 degrees.  She can flex to about 100 degrees.  The notes from therapy state that she is also doing well.  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  She would like to return to that starting tomorrow and feels that she is able to do that.  I gave her a note reflecting that I am fine with her returning to work at that level of work duties.  All questions and concerns were answered addressed.  She will continue physical therapy as well.  At this point I do not need to see her back for 6 months unless she is having issues.  At that visit like an AP and lateral of her left operative knee.

## 2019-10-08 ENCOUNTER — Other Ambulatory Visit: Payer: Self-pay | Admitting: Orthopaedic Surgery

## 2019-10-08 NOTE — Telephone Encounter (Signed)
Please advise 

## 2019-10-09 ENCOUNTER — Ambulatory Visit: Payer: No Typology Code available for payment source | Admitting: Orthopaedic Surgery

## 2019-10-26 ENCOUNTER — Encounter: Payer: Self-pay | Admitting: Orthopaedic Surgery

## 2019-10-26 ENCOUNTER — Telehealth: Payer: Self-pay | Admitting: Orthopaedic Surgery

## 2019-10-26 NOTE — Telephone Encounter (Signed)
2X called patient and left VM for her to call and set appt for consultation of right knee replacement surgery. Will try again later.

## 2019-11-19 ENCOUNTER — Ambulatory Visit (INDEPENDENT_AMBULATORY_CARE_PROVIDER_SITE_OTHER): Payer: No Typology Code available for payment source | Admitting: Orthopaedic Surgery

## 2019-11-19 DIAGNOSIS — M1711 Unilateral primary osteoarthritis, right knee: Secondary | ICD-10-CM

## 2019-11-19 DIAGNOSIS — G8929 Other chronic pain: Secondary | ICD-10-CM | POA: Diagnosis not present

## 2019-11-19 DIAGNOSIS — M25561 Pain in right knee: Secondary | ICD-10-CM

## 2019-11-19 MED ORDER — METHYLPREDNISOLONE ACETATE 40 MG/ML IJ SUSP
40.0000 mg | INTRAMUSCULAR | Status: AC | PRN
  Administered 2019-11-19: 40 mg via INTRA_ARTICULAR

## 2019-11-19 MED ORDER — LIDOCAINE HCL 1 % IJ SOLN
3.0000 mL | INTRAMUSCULAR | Status: AC | PRN
  Administered 2019-11-19: 3 mL

## 2019-11-19 NOTE — Progress Notes (Signed)
Office Visit Note   Patient: Tiffany Stout           Date of Birth: 11/09/1965           MRN: [id number] Visit Date: 11/19/2019              Requested by: Ignatius Specking, MD [address] [place],  [place] [address] PCP: Ignatius Specking, MD   Assessment & Plan: Visit Diagnoses:  1. Chronic pain of right knee   2. Unilateral primary osteoarthritis, right knee     Plan: At this point given the severity of her right knee pain I am recommending a total knee arthroplasty on the right side.  She has her surgery scheduler's card and she is on what can do her FMLA as well as her short-term disability before scheduling this.  I did offer a steroid injection today given the fact that she has not had one for a while and she is having such acute severe pain, she did tolerate this injection well and agreed to it.  She will try a cane in her opposite hand offload the knee.  She will call us when she would like [place] to schedule her for a right total knee arthroplasty.  Follow-Up Instructions: Return for 2 weeks post-op.   Orders:  Orders Placed This Encounter  Procedures  . Large Joint Inj   No orders of the defined types were placed in this encounter.     Procedures: Large Joint Inj: R knee on 11/19/2019 10:53 AM Indications: diagnostic evaluation and pain Details: 22 G 1.5 in needle, superolateral approach  Arthrogram: No  Medications: 3 mL lidocaine 1 %; 40 mg methylPREDNISolone acetate 40 MG/ML Outcome: tolerated well, no immediate complications Procedure, treatment alternatives, risks and benefits explained, specific risks discussed. Consent was given by the patient. Immediately prior to procedure a time out was called to verify the correct patient, procedure, equipment, support staff and site/side marked as required. Patient was prepped and draped in the usual sterile fashion.       Clinical Data: No additional findings.   Subjective: Chief Complaint  Patient presents with  . Right  Knee - Pain  The patient comes in today with acute on chronic right knee pain.  She has known severe end-stage arthritis of the right knee.  We actually replaced her left knee 6 months ago and that knee has done very well for her.  She is at the point where she wants to consider knee replacement surgery on the right side but she is concerned about being out of work as it relates to her FMLA or even short-term disability.  Her right knee pain has become daily and severe.  It is detrimentally affecting her mobility, her quality of life and her actives daily living.  She has tried failed conservative treatment for over a year without right knee with activity modification as well as quad strengthening exercises and therapy.  When they did therapy on the left knee they did work on her right knee.  She has not had a steroid injection in that right knee since 2019.  HPI  Review of Systems She currently denies any headache, chest pain, shortness of breath, fever, chills, nausea, vomiting  Objective: Vital Signs: There were no vitals taken for this visit.  Physical Exam She is alert and orient x3 and in no acute distress Ortho Exam Examination of her left operative knee shows she lacks full extension by just a degree or  2.  She flexes to about 110 degrees.  Feels ligamentously stable.  Examination of her right knee shows varus malalignment with a mild effusion.  There is painful arc of motion throughout her range of motion.  The knee is ligamentously stable but very painful along the medial joint line and the patellofemoral joint. Specialty Comments:  No specialty comments available.  Imaging: No results found. Previous x-rays of the right knee from earlier this year show significant varus malalignment.  There is complete loss of the medial joint space with bone-on-bone wear.  There is significant patellofemoral disease as well.  PMFS History: Patient Active Problem List   Diagnosis Date Noted  . 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 Status post total left knee replacement 06/05/2019  . Unilateral primary osteoarthritis, left knee 04/09/2019  . Unilateral primary osteoarthritis, right knee 04/09/2019  . IBS (irritable bowel syndrome)   . GERD (gastroesophageal reflux disease) 11/08/2011   Past Medical History:  Diagnosis Date  . Anxiety   . Arthritis   . Fibromyalgia   . GERD (gastroesophageal reflux disease)   . Hypertension   . IBS (irritable bowel syndrome)     No family history on file.  Past Surgical History:  Procedure Laterality Date  . ABDOMINAL HYSTERECTOMY    . CHOLECYSTECTOMY    . ESOPHAGOGASTRODUODENOSCOPY  11/11/2011   Procedure: ESOPHAGOGASTRODUODENOSCOPY (EGD);  Surgeon: Malissa Hippo, MD;  [facility];  Service: Endoscopy;  Laterality: N/A;  315  . TONSILLECTOMY    . TOTAL KNEE ARTHROPLASTY Left 06/05/2019   Procedure: LEFT TOTAL KNEE ARTHROPLASTY;  Surgeon: Kathryne Hitch, MD;  Location: [facility] OR;  Service: Orthopedics;  Laterality: Left;   Social History   Occupational History  . Not on file  Tobacco Use  . Smoking status: Former Games developer  . Smokeless tobacco: Never Used  . Tobacco comment: Quit 20 years ago  Vaping Use  . Vaping Use: Never used  Substance and Sexual Activity  . Alcohol use: No  . Drug use: No  . Sexual activity: Not on file

## 2019-11-28 ENCOUNTER — Telehealth: Payer: Self-pay

## 2019-11-28 NOTE — Telephone Encounter (Signed)
Faxed note. Called patient. No answer. Left message that this had been taken care of.

## 2019-11-28 NOTE — Telephone Encounter (Signed)
How long would you like patient out of work for knee replacement?

## 2019-11-28 NOTE — Telephone Encounter (Signed)
She needs to plan on being out of work anywhere from 6 to 12 weeks.  This depends on her recovery from surgery.

## 2019-11-28 NOTE — Telephone Encounter (Signed)
Email from patient, needs OOW note:  I have talked to you on Tuesday Oct. 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 Will you help me by sending a letter stated that I will have to be out of work on Nov. 16th and with recovery time? Here is a fax number at [facility] [phone], please address to Bryan Lemma. Thank you so much for your help. Tiffany Stout

## 2019-12-12 ENCOUNTER — Other Ambulatory Visit: Payer: Self-pay

## 2019-12-17 ENCOUNTER — Other Ambulatory Visit: Payer: Self-pay | Admitting: Physician Assistant

## 2019-12-21 ENCOUNTER — Encounter (HOSPITAL_COMMUNITY)
Admission: RE | Admit: 2019-12-21 | Discharge: 2019-12-21 | Disposition: A | Discharge status: Home / Self Care | Payer: PRIVATE HEALTH INSURANCE | Source: Ambulatory Visit | Attending: Orthopaedic Surgery | Admitting: Orthopaedic Surgery

## 2019-12-21 ENCOUNTER — Encounter (HOSPITAL_COMMUNITY): Payer: Self-pay

## 2019-12-21 ENCOUNTER — Other Ambulatory Visit: Payer: Self-pay

## 2019-12-21 ENCOUNTER — Other Ambulatory Visit (HOSPITAL_COMMUNITY)
Admission: RE | Admit: 2019-12-21 | Discharge: 2019-12-21 | Disposition: A | Discharge status: Home / Self Care | Payer: PRIVATE HEALTH INSURANCE | Source: Ambulatory Visit | Attending: Orthopaedic Surgery | Admitting: Orthopaedic Surgery

## 2019-12-21 DIAGNOSIS — Z20822 Contact with and (suspected) exposure to covid-19: Secondary | ICD-10-CM | POA: Insufficient documentation

## 2019-12-21 DIAGNOSIS — Z01812 Encounter for preprocedural laboratory examination: Secondary | ICD-10-CM | POA: Insufficient documentation

## 2019-12-21 HISTORY — DX: Personal history of other medical treatment: Z92.89

## 2019-12-21 LAB — CBC
HCT: 41.9 % (ref 36.0–46.0)
Hemoglobin: 13.5 g/dL (ref 12.0–15.0)
MCH: 30.4 pg (ref 26.0–34.0)
MCHC: 32.2 g/dL (ref 30.0–36.0)
MCV: 94.4 fL (ref 80.0–100.0)
Platelets: 234 10*3/uL (ref 150–400)
RBC: 4.44 MIL/uL (ref 3.87–5.11)
RDW: 13.2 % (ref 11.5–15.5)
WBC: 6.1 10*3/uL (ref 4.0–10.5)
nRBC: 0 % (ref 0.0–0.2)

## 2019-12-21 LAB — TYPE AND SCREEN
ABO/RH(D): AB POS
Antibody Screen: NEGATIVE

## 2019-12-21 LAB — BASIC METABOLIC PANEL WITH GFR
Anion gap: 6 (ref 5–15)
BUN: 8 mg/dL (ref 6–20)
CO2: 28 mmol/L (ref 22–32)
Calcium: 9.3 mg/dL (ref 8.9–10.3)
Chloride: 105 mmol/L (ref 98–111)
Creatinine, Ser: 0.74 mg/dL (ref 0.44–1.00)
GFR, Estimated: >60 mL/min
Glucose, Bld: 92 mg/dL (ref 70–99)
Potassium: 3.7 mmol/L (ref 3.5–5.1)
Sodium: 139 mmol/L (ref 135–145)

## 2019-12-21 LAB — SURGICAL PCR SCREEN
MRSA, PCR: NEGATIVE
Staphylococcus aureus: NEGATIVE

## 2019-12-21 LAB — SARS CORONAVIRUS 2 (TAT 6-24 HRS): SARS Coronavirus 2: NEGATIVE

## 2019-12-21 NOTE — Progress Notes (Signed)
PCP -  [facility] Medicine  Cardiologist - n/a  Chest x-ray - n/a EKG - 06/01/19 Stress Test - n/a ECHO - n/a Cardiac Cath - n/a  ERAS: Clears til 9 am DOS, Ensure drink give at PAT appt.  Coronavirus Screening Covid test scheduled on 12/21/19 Do you have any of the following symptoms:  Cough yes/no: No Fever (>100.65F)  yes/no: No Runny nose yes/no: No Sore throat yes/no: No Difficulty breathing/shortness of breath  yes/no: No  Have you traveled in the last 14 days and where? yes/no: No  Patient verbalized understanding of instructions that were given to them at the PAT appointment.

## 2019-12-21 NOTE — Progress Notes (Addendum)
[facility] - [place], [place] - [address] Jerolyn Shin [place] [address] Phone: [phone] Fax: [phone]      Your procedure is scheduled on Tuesday, 12/25/19.  Report to [facility] Main Entrance "A" at 10 am A.M., and check in at the Admitting office.  Call this number if you have problems the morning of surgery:  [phone]  Call [phone] if you have any questions prior to your surgery date Monday-Friday 8am-4pm    Remember:  Do not eat after midnight the night before your surgery-Monday  You may drink clear liquids until 9 am the morning of your surgery-Tuesday.   Clear liquids allowed are: Water, Non-Citrus Juices (without pulp), Carbonated Beverages, Clear Tea, Black Coffee Only, and Gatorade    Please complete your PRE-SURGERY ENSURE that was provided to you by 9 am the morning of surgery.  Please, if able, drink it in one setting. DO NOT SIP.   Take these medicines the morning of surgery with A SIP OF WATER:  FLUoxetine (PROZAC)   As of today, STOP taking any Aspirin (unless otherwise instructed by your surgeon) Aleve, Naproxen, Ibuprofen, Motrin, Advil, Goody's, BC's, all herbal medications, fish oil, and all vitamins.                      Do not wear jewelry, make up, or nail polish            Do not wear lotions, powders, perfumes, or deodorant.            Do not shave 48 hours prior to surgery.            Do not bring valuables to the hospital.            [facility] is not responsible for any belongings or valuables.  Do NOT Smoke (Tobacco/Vaping) or drink Alcohol 24 hours prior to your procedure If you use a CPAP at night, you may bring all equipment for your overnight stay.   Contacts, glasses, dentures or bridgework may not be worn into surgery.      For patients admitted to the hospital, discharge time will be determined by your treatment team.   Patients discharged the day of surgery will not be allowed to drive home, and  someone needs to stay with them for 24 hours.    Special instructions:   [facility]- Preparing For Surgery  Before surgery, you can play an important role. Because skin is not sterile, your skin needs to be as free of germs as possible. You can reduce the number of germs on your skin by washing with CHG (chlorahexidine gluconate) Soap before surgery.  CHG is an antiseptic cleaner which kills germs and bonds with the skin to continue killing germs even after washing.    Oral Hygiene is also important to reduce your risk of infection.  Remember - BRUSH YOUR TEETH THE MORNING OF SURGERY WITH YOUR REGULAR TOOTHPASTE  Please do not use if you have an allergy to CHG or antibacterial soaps. If your skin becomes reddened/irritated stop using the CHG.  Do not shave (including legs and underarms) for at least 48 hours prior to first CHG shower. It is OK to shave your face.  Please follow these instructions carefully.   1. Shower the NIGHT BEFORE SURGERY-Mon and the MORNING OF SURGERY-Tues with CHG Soap.   2. If you chose to wash your hair, wash your hair first as usual  with your normal shampoo.  3. After you shampoo, rinse your hair and body thoroughly to remove the shampoo.  4. Use CHG as you would any other liquid soap. You can apply CHG directly to the skin and wash gently with a scrungie or a clean washcloth.   5. Apply the CHG Soap to your body ONLY FROM THE NECK DOWN.  Do not use on open wounds or open sores. Avoid contact with your eyes, ears, mouth and genitals (private parts). Wash Face and genitals (private parts)  with your normal soap.   6. Wash thoroughly, paying special attention to the area where your surgery will be performed.  7. Thoroughly rinse your body with warm water from the neck down.  8. DO NOT shower/wash with your normal soap after using and rinsing off the CHG Soap.  9. Pat yourself dry with a CLEAN TOWEL.  10. Wear CLEAN PAJAMAS to bed the night before  surgery  11. Place CLEAN SHEETS on your bed the night of your first shower and DO NOT SLEEP WITH PETS.   Day of Surgery: Wear Clean/Comfortable clothing the morning of surgery Do not apply any deodorants/lotions.   Remember to brush your teeth WITH YOUR REGULAR TOOTHPASTE.   Please read over the following fact sheets that you were given.

## 2019-12-25 ENCOUNTER — Encounter (HOSPITAL_COMMUNITY)
Admission: RE | Disposition: A | Discharge status: Home / Self Care | Payer: Self-pay | Source: Home / Self Care | Attending: Orthopaedic Surgery

## 2019-12-25 ENCOUNTER — Ambulatory Visit (HOSPITAL_COMMUNITY): Payer: PRIVATE HEALTH INSURANCE | Admitting: Anesthesiology

## 2019-12-25 ENCOUNTER — Encounter (HOSPITAL_COMMUNITY): Payer: Self-pay | Admitting: Orthopaedic Surgery

## 2019-12-25 ENCOUNTER — Observation Stay (HOSPITAL_COMMUNITY)
Admission: RE | Admit: 2019-12-25 | Discharge: 2019-12-26 | Disposition: A | Discharge status: Home / Self Care | Payer: PRIVATE HEALTH INSURANCE | Attending: Orthopaedic Surgery | Admitting: Orthopaedic Surgery

## 2019-12-25 ENCOUNTER — Observation Stay (HOSPITAL_COMMUNITY): Payer: PRIVATE HEALTH INSURANCE

## 2019-12-25 DIAGNOSIS — M1711 Unilateral primary osteoarthritis, right knee: Secondary | ICD-10-CM | POA: Diagnosis not present

## 2019-12-25 DIAGNOSIS — Z87891 Personal history of nicotine dependence: Secondary | ICD-10-CM | POA: Insufficient documentation

## 2019-12-25 DIAGNOSIS — Z96652 Presence of left artificial knee joint: Secondary | ICD-10-CM | POA: Insufficient documentation

## 2019-12-25 DIAGNOSIS — I10 Essential (primary) hypertension: Secondary | ICD-10-CM | POA: Insufficient documentation

## 2019-12-25 DIAGNOSIS — M25561 Pain in right knee: Secondary | ICD-10-CM | POA: Diagnosis present

## 2019-12-25 DIAGNOSIS — Z79899 Other long term (current) drug therapy: Secondary | ICD-10-CM | POA: Insufficient documentation

## 2019-12-25 DIAGNOSIS — Z96651 Presence of right artificial knee joint: Secondary | ICD-10-CM

## 2019-12-25 HISTORY — PX: TOTAL KNEE ARTHROPLASTY: SHX125

## 2019-12-25 LAB — ABO/RH: ABO/RH(D): AB POS

## 2019-12-25 SURGERY — ARTHROPLASTY, KNEE, TOTAL
Anesthesia: Regional | Site: Knee | Laterality: Right

## 2019-12-25 MED ORDER — METHOCARBAMOL 1000 MG/10ML IJ SOLN
500.0000 mg | Freq: Four times a day (QID) | INTRAVENOUS | Status: DC | PRN
  Filled 2019-12-25: qty 5

## 2019-12-25 MED ORDER — LIDOCAINE 2% (20 MG/ML) 5 ML SYRINGE: INTRAMUSCULAR | Status: DC | PRN

## 2019-12-25 MED ORDER — SODIUM CHLORIDE 0.9 % IR SOLN
Status: DC | PRN
  Administered 2019-12-25: 3000 mL

## 2019-12-25 MED ORDER — MIDAZOLAM HCL 2 MG/2ML IJ SOLN
INTRAMUSCULAR | Status: AC
  Administered 2019-12-25: 2 mg via INTRAVENOUS
  Filled 2019-12-25: qty 2

## 2019-12-25 MED ORDER — OXYCODONE HCL 5 MG PO TABS: 5.0000 mg | ORAL_TABLET | Freq: Once | ORAL | Status: DC | PRN

## 2019-12-25 MED ORDER — EPHEDRINE 5 MG/ML INJ
INTRAVENOUS | Status: AC
  Filled 2019-12-25: qty 10

## 2019-12-25 MED ORDER — PROPOFOL 10 MG/ML IV BOLUS
INTRAVENOUS | Status: DC | PRN
  Administered 2019-12-25: 90 mg via INTRAVENOUS
  Administered 2019-12-25: 130 mg via INTRAVENOUS

## 2019-12-25 MED ORDER — DEXAMETHASONE SODIUM PHOSPHATE 10 MG/ML IJ SOLN
INTRAMUSCULAR | Status: DC | PRN
  Administered 2019-12-25: 5 mg via INTRAVENOUS

## 2019-12-25 MED ORDER — METOCLOPRAMIDE HCL 5 MG PO TABS: 5.0000 mg | ORAL_TABLET | Freq: Three times a day (TID) | ORAL | Status: DC | PRN

## 2019-12-25 MED ORDER — FLUOXETINE HCL 20 MG PO CAPS
20.0000 mg | ORAL_CAPSULE | Freq: Every day | ORAL | Status: DC
  Administered 2019-12-26: 20 mg via ORAL
  Filled 2019-12-25: qty 1

## 2019-12-25 MED ORDER — ONDANSETRON HCL 4 MG/2ML IJ SOLN
INTRAMUSCULAR | Status: DC | PRN
  Administered 2019-12-25: 4 mg via INTRAVENOUS

## 2019-12-25 MED ORDER — BUPIVACAINE-EPINEPHRINE (PF) 0.25% -1:200000 IJ SOLN
INTRAMUSCULAR | Status: AC
  Filled 2019-12-25: qty 30

## 2019-12-25 MED ORDER — ONDANSETRON HCL 4 MG PO TABS: 4.0000 mg | ORAL_TABLET | Freq: Four times a day (QID) | ORAL | Status: DC | PRN

## 2019-12-25 MED ORDER — CEFAZOLIN SODIUM-DEXTROSE 1-4 GM/50ML-% IV SOLN
1.0000 g | Freq: Four times a day (QID) | INTRAVENOUS | Status: AC
  Administered 2019-12-25 (×2): 1 g via INTRAVENOUS
  Filled 2019-12-25 (×2): qty 50

## 2019-12-25 MED ORDER — MIDAZOLAM HCL 2 MG/2ML IJ SOLN
INTRAMUSCULAR | Status: DC | PRN
  Administered 2019-12-25 (×2): 1 mg via INTRAVENOUS

## 2019-12-25 MED ORDER — HYDROMORPHONE HCL 1 MG/ML IJ SOLN
0.5000 mg | INTRAMUSCULAR | Status: DC | PRN
  Administered 2019-12-25: 1 mg via INTRAVENOUS
  Filled 2019-12-25: qty 1

## 2019-12-25 MED ORDER — ROPIVACAINE HCL 5 MG/ML IJ SOLN
INTRAMUSCULAR | Status: DC | PRN
  Administered 2019-12-25: 30 mL via PERINEURAL

## 2019-12-25 MED ORDER — CHLORHEXIDINE GLUCONATE 0.12 % MT SOLN
OROMUCOSAL | Status: AC
  Administered 2019-12-25: 15 mL via OROMUCOSAL
  Filled 2019-12-25: qty 15

## 2019-12-25 MED ORDER — SODIUM CHLORIDE 0.9 % IV SOLN: INTRAVENOUS | Status: DC

## 2019-12-25 MED ORDER — FENTANYL CITRATE (PF) 250 MCG/5ML IJ SOLN
INTRAMUSCULAR | Status: AC
  Filled 2019-12-25: qty 5

## 2019-12-25 MED ORDER — HYDROMORPHONE HCL 1 MG/ML IJ SOLN: 0.2500 mg | INTRAMUSCULAR | Status: DC | PRN

## 2019-12-25 MED ORDER — DIPHENHYDRAMINE HCL 12.5 MG/5ML PO ELIX
12.5000 mg | ORAL_SOLUTION | ORAL | Status: DC | PRN
  Filled 2019-12-25: qty 10

## 2019-12-25 MED ORDER — MIDAZOLAM HCL 2 MG/2ML IJ SOLN
INTRAMUSCULAR | Status: AC
  Filled 2019-12-25: qty 2

## 2019-12-25 MED ORDER — CHLORHEXIDINE GLUCONATE 0.12 % MT SOLN
15.0000 mL | Freq: Once | OROMUCOSAL | Status: AC
  Filled 2019-12-25: qty 15

## 2019-12-25 MED ORDER — FENTANYL CITRATE (PF) 100 MCG/2ML IJ SOLN
INTRAMUSCULAR | Status: AC
  Administered 2019-12-25: 100 ug via INTRAVENOUS
  Filled 2019-12-25: qty 2

## 2019-12-25 MED ORDER — PHENOL 1.4 % MT LIQD: 1.0000 | OROMUCOSAL | Status: DC | PRN

## 2019-12-25 MED ORDER — MIDAZOLAM HCL 2 MG/2ML IJ SOLN: 2.0000 mg | Freq: Once | INTRAMUSCULAR | Status: AC

## 2019-12-25 MED ORDER — HYDROMORPHONE HCL 1 MG/ML IJ SOLN
INTRAMUSCULAR | Status: AC
  Administered 2019-12-25: 0.25 mg via INTRAVENOUS
  Filled 2019-12-25: qty 1

## 2019-12-25 MED ORDER — DOCUSATE SODIUM 100 MG PO CAPS
100.0000 mg | ORAL_CAPSULE | Freq: Two times a day (BID) | ORAL | Status: DC
  Administered 2019-12-25 – 2019-12-26 (×2): 100 mg via ORAL
  Filled 2019-12-25 (×2): qty 1

## 2019-12-25 MED ORDER — PANTOPRAZOLE SODIUM 40 MG PO TBEC
40.0000 mg | DELAYED_RELEASE_TABLET | Freq: Every day | ORAL | Status: DC
  Administered 2019-12-25 – 2019-12-26 (×2): 40 mg via ORAL
  Filled 2019-12-25 (×2): qty 1

## 2019-12-25 MED ORDER — PROPOFOL 10 MG/ML IV BOLUS
INTRAVENOUS | Status: AC
  Filled 2019-12-25: qty 20

## 2019-12-25 MED ORDER — TRANEXAMIC ACID-NACL 1000-0.7 MG/100ML-% IV SOLN
1000.0000 mg | INTRAVENOUS | Status: AC
  Administered 2019-12-25: 1000 mg via INTRAVENOUS
  Filled 2019-12-25: qty 100

## 2019-12-25 MED ORDER — MEPERIDINE HCL 25 MG/ML IJ SOLN: 6.2500 mg | INTRAMUSCULAR | Status: DC | PRN

## 2019-12-25 MED ORDER — OXYCODONE HCL 5 MG PO TABS
10.0000 mg | ORAL_TABLET | ORAL | Status: DC | PRN
  Filled 2019-12-25: qty 2

## 2019-12-25 MED ORDER — CLONAZEPAM 0.5 MG PO TABS
1.0000 mg | ORAL_TABLET | Freq: Every evening | ORAL | Status: DC | PRN
  Administered 2019-12-25: 1 mg via ORAL
  Filled 2019-12-25: qty 2

## 2019-12-25 MED ORDER — ALUM & MAG HYDROXIDE-SIMETH 200-200-20 MG/5ML PO SUSP: 30.0000 mL | ORAL | Status: DC | PRN

## 2019-12-25 MED ORDER — ACETAMINOPHEN 500 MG PO TABS
1000.0000 mg | ORAL_TABLET | Freq: Once | ORAL | Status: AC
  Administered 2019-12-25: 1000 mg via ORAL
  Filled 2019-12-25: qty 2

## 2019-12-25 MED ORDER — ACETAMINOPHEN 325 MG PO TABS
325.0000 mg | ORAL_TABLET | Freq: Four times a day (QID) | ORAL | Status: DC | PRN
  Administered 2019-12-26 (×2): 650 mg via ORAL
  Filled 2019-12-25 (×2): qty 2

## 2019-12-25 MED ORDER — ASPIRIN 81 MG PO CHEW
81.0000 mg | CHEWABLE_TABLET | Freq: Two times a day (BID) | ORAL | Status: DC
  Administered 2019-12-25 – 2019-12-26 (×2): 81 mg via ORAL
  Filled 2019-12-25 (×2): qty 1

## 2019-12-25 MED ORDER — POVIDONE-IODINE 10 % EX SWAB: 2.0000 "application " | Freq: Once | CUTANEOUS | Status: DC

## 2019-12-25 MED ORDER — FENTANYL CITRATE (PF) 100 MCG/2ML IJ SOLN: 100.0000 ug | Freq: Once | INTRAMUSCULAR | Status: AC

## 2019-12-25 MED ORDER — ONDANSETRON HCL 4 MG/2ML IJ SOLN: 4.0000 mg | Freq: Four times a day (QID) | INTRAMUSCULAR | Status: DC | PRN

## 2019-12-25 MED ORDER — ONDANSETRON HCL 4 MG/2ML IJ SOLN
INTRAMUSCULAR | Status: AC
  Filled 2019-12-25: qty 2

## 2019-12-25 MED ORDER — LACTATED RINGERS IV SOLN: INTRAVENOUS | Status: DC

## 2019-12-25 MED ORDER — KETOROLAC TROMETHAMINE 15 MG/ML IJ SOLN
15.0000 mg | Freq: Four times a day (QID) | INTRAMUSCULAR | Status: AC
  Administered 2019-12-25 – 2019-12-26 (×4): 15 mg via INTRAVENOUS
  Filled 2019-12-25 (×4): qty 1

## 2019-12-25 MED ORDER — POLYETHYLENE GLYCOL 3350 17 G PO PACK: 17.0000 g | PACK | Freq: Every day | ORAL | Status: DC | PRN

## 2019-12-25 MED ORDER — CEFAZOLIN SODIUM-DEXTROSE 2-4 GM/100ML-% IV SOLN
2.0000 g | INTRAVENOUS | Status: AC
  Administered 2019-12-25: 2 g via INTRAVENOUS
  Filled 2019-12-25: qty 100

## 2019-12-25 MED ORDER — EPHEDRINE SULFATE-NACL 50-0.9 MG/10ML-% IV SOSY
PREFILLED_SYRINGE | INTRAVENOUS | Status: DC | PRN
  Administered 2019-12-25: 5 mg via INTRAVENOUS

## 2019-12-25 MED ORDER — PHENYLEPHRINE 40 MCG/ML (10ML) SYRINGE FOR IV PUSH (FOR BLOOD PRESSURE SUPPORT)
PREFILLED_SYRINGE | INTRAVENOUS | Status: AC
  Filled 2019-12-25: qty 10

## 2019-12-25 MED ORDER — PROMETHAZINE HCL 25 MG/ML IJ SOLN: 6.2500 mg | INTRAMUSCULAR | Status: DC | PRN

## 2019-12-25 MED ORDER — 0.9 % SODIUM CHLORIDE (POUR BTL) OPTIME
TOPICAL | Status: DC | PRN
  Administered 2019-12-25: 1000 mL

## 2019-12-25 MED ORDER — FENTANYL CITRATE (PF) 250 MCG/5ML IJ SOLN
INTRAMUSCULAR | Status: DC | PRN
  Administered 2019-12-25: 25 ug via INTRAVENOUS
  Administered 2019-12-25: 50 ug via INTRAVENOUS
  Administered 2019-12-25: 25 ug via INTRAVENOUS

## 2019-12-25 MED ORDER — OXYCODONE HCL 5 MG PO TABS
5.0000 mg | ORAL_TABLET | ORAL | Status: DC | PRN
  Administered 2019-12-25 – 2019-12-26 (×5): 10 mg via ORAL
  Filled 2019-12-25 (×4): qty 2

## 2019-12-25 MED ORDER — OXYCODONE HCL 5 MG/5ML PO SOLN: 5.0000 mg | Freq: Once | ORAL | Status: DC | PRN

## 2019-12-25 MED ORDER — DEXAMETHASONE SODIUM PHOSPHATE 10 MG/ML IJ SOLN
INTRAMUSCULAR | Status: DC | PRN
  Administered 2019-12-25: 10 mg

## 2019-12-25 MED ORDER — LIDOCAINE 2% (20 MG/ML) 5 ML SYRINGE
INTRAMUSCULAR | Status: AC
  Filled 2019-12-25: qty 5

## 2019-12-25 MED ORDER — GABAPENTIN 100 MG PO CAPS
100.0000 mg | ORAL_CAPSULE | Freq: Three times a day (TID) | ORAL | Status: DC
  Administered 2019-12-25 – 2019-12-26 (×3): 100 mg via ORAL
  Filled 2019-12-25 (×3): qty 1

## 2019-12-25 MED ORDER — METOCLOPRAMIDE HCL 5 MG/ML IJ SOLN: 5.0000 mg | Freq: Three times a day (TID) | INTRAMUSCULAR | Status: DC | PRN

## 2019-12-25 MED ORDER — METHOCARBAMOL 500 MG PO TABS
500.0000 mg | ORAL_TABLET | Freq: Four times a day (QID) | ORAL | Status: DC | PRN
  Administered 2019-12-26: 500 mg via ORAL
  Filled 2019-12-25 (×2): qty 1

## 2019-12-25 MED ORDER — KETOROLAC TROMETHAMINE 30 MG/ML IJ SOLN: 30.0000 mg | Freq: Once | INTRAMUSCULAR | Status: AC | PRN

## 2019-12-25 MED ORDER — BUPIVACAINE-EPINEPHRINE 0.25% -1:200000 IJ SOLN
INTRAMUSCULAR | Status: DC | PRN
  Administered 2019-12-25: 30 mL

## 2019-12-25 MED ORDER — MENTHOL 3 MG MT LOZG: 1.0000 | LOZENGE | OROMUCOSAL | Status: DC | PRN

## 2019-12-25 MED ORDER — KETOROLAC TROMETHAMINE 30 MG/ML IJ SOLN
INTRAMUSCULAR | Status: AC
  Administered 2019-12-25: 30 mg via INTRAVENOUS
  Filled 2019-12-25: qty 1

## 2019-12-25 SURGICAL SUPPLY — 52 items
BANDAGE ESMARK 6X9 LF (GAUZE/BANDAGES/DRESSINGS) ×1 IMPLANT
BLADE SAG 18X100X1.27 (BLADE) ×3 IMPLANT
BNDG CMPR 9X6 STRL LF SNTH (GAUZE/BANDAGES/DRESSINGS) ×1
BNDG ELASTIC 6X5.8 VLCR STR LF (GAUZE/BANDAGES/DRESSINGS) ×2 IMPLANT
BNDG ESMARK 6X9 LF (GAUZE/BANDAGES/DRESSINGS) ×3
BSPLAT TIB 3 KN TRITANIUM (Knees) ×1 IMPLANT
COVER SURGICAL LIGHT HANDLE (MISCELLANEOUS) ×3 IMPLANT
CUFF TOURN SGL QUICK 34 (TOURNIQUET CUFF) ×3
CUFF TRNQT CYL 34X4.125X (TOURNIQUET CUFF) ×1 IMPLANT
DRAPE EXTREMITY T 121X128X90 (DISPOSABLE) ×3 IMPLANT
DRAPE HALF SHEET 40X57 (DRAPES) ×3 IMPLANT
DRAPE U-SHAPE 47X51 STRL (DRAPES) ×3 IMPLANT
DURAPREP 26ML APPLICATOR (WOUND CARE) ×3 IMPLANT
ELECT CAUTERY BLADE 6.4 (BLADE) ×3 IMPLANT
ELECT REM PT RETURN 9FT ADLT (ELECTROSURGICAL) ×3
ELECTRODE REM PT RTRN 9FT ADLT (ELECTROSURGICAL) ×1 IMPLANT
FACESHIELD WRAPAROUND (MASK) ×6 IMPLANT
FACESHIELD WRAPAROUND OR TEAM (MASK) ×2 IMPLANT
FEMORAL POSTERIOR SZ3 RT (Joint) IMPLANT
GAUZE SPONGE 4X4 12PLY STRL (GAUZE/BANDAGES/DRESSINGS) ×2 IMPLANT
GAUZE XEROFORM 1X8 LF (GAUZE/BANDAGES/DRESSINGS) ×3 IMPLANT
GAUZE XEROFORM 5X9 LF (GAUZE/BANDAGES/DRESSINGS) ×2 IMPLANT
GLOVE BIOGEL PI IND STRL 8 (GLOVE) ×2 IMPLANT
GLOVE BIOGEL PI INDICATOR 8 (GLOVE) ×4
GLOVE ORTHO TXT STRL SZ7.5 (GLOVE) ×3 IMPLANT
GLOVE SURG ORTHO 8.0 STRL STRW (GLOVE) ×3 IMPLANT
GOWN STRL REUS W/ TWL XL LVL3 (GOWN DISPOSABLE) ×2 IMPLANT
GOWN STRL REUS W/TWL XL LVL3 (GOWN DISPOSABLE) ×6
HANDPIECE INTERPULSE COAX TIP (DISPOSABLE) ×3
INSERT PS TRIATH X3 #3 10 (Insert) ×2 IMPLANT
KIT BASIN OR (CUSTOM PROCEDURE TRAY) ×3 IMPLANT
KIT TURNOVER KIT B (KITS) ×3 IMPLANT
KNEE PATELLA ASYMMETRIC 9X29 (Knees) ×2 IMPLANT
KNEE TIBIAL COMPONENT SZ3 (Knees) ×2 IMPLANT
MANIFOLD NEPTUNE II (INSTRUMENTS) ×3 IMPLANT
NS IRRIG 1000ML POUR BTL (IV SOLUTION) ×3 IMPLANT
PACK TOTAL JOINT (CUSTOM PROCEDURE TRAY) ×3 IMPLANT
PAD ABD 8X10 STRL (GAUZE/BANDAGES/DRESSINGS) ×2 IMPLANT
PADDING CAST SYN 6 (CAST SUPPLIES) ×2
PADDING CAST SYNTHETIC 6X4 NS (CAST SUPPLIES) IMPLANT
POSTERIOR FEMORAL SZ3 RT (Joint) ×3 IMPLANT
SET HNDPC FAN SPRY TIP SCT (DISPOSABLE) ×1 IMPLANT
SET PAD KNEE POSITIONER (MISCELLANEOUS) ×3 IMPLANT
SUT MNCRL AB 4-0 PS2 18 (SUTURE) IMPLANT
SUT VIC AB 0 CT1 27 (SUTURE) ×3
SUT VIC AB 0 CT1 27XBRD ANBCTR (SUTURE) ×1 IMPLANT
SUT VIC AB 1 CT1 27 (SUTURE) ×6
SUT VIC AB 1 CT1 27XBRD ANBCTR (SUTURE) ×2 IMPLANT
SUT VIC AB 2-0 CT1 27 (SUTURE) ×6
SUT VIC AB 2-0 CT1 TAPERPNT 27 (SUTURE) ×2 IMPLANT
TOWEL GREEN STERILE (TOWEL DISPOSABLE) ×3 IMPLANT
TOWEL GREEN STERILE FF (TOWEL DISPOSABLE) ×3 IMPLANT

## 2019-12-25 NOTE — Anesthesia Procedure Notes (Signed)
Anesthesia Regional Block: Adductor canal block   Pre-Anesthetic Checklist: ,, timeout performed, Correct Patient, Correct Site, Correct Laterality, Correct Procedure, Correct Position, site marked, Risks and benefits discussed,  Surgical consent,  Pre-op evaluation,  At surgeon's request and post-op pain management  Laterality: Right  Prep: Maximum Sterile Barrier Precautions used, chloraprep       Needles:  Injection technique: Single-shot  Needle Type: Echogenic Stimulator Needle     Needle Length: 9cm  Needle Gauge: 22     Additional Needles:   Procedures:,,,, ultrasound used (permanent image in chart),,,,  Narrative:  Start time: 12/25/2019 11:30 AM End time: 12/25/2019 11:40 AM Injection made incrementally with aspirations every 5 mL.  Performed by: Personally  Anesthesiologist: Lannie Fields, DO  Additional Notes: Monitors applied. No increased pain on injection. No increased resistance to injection. Injection made in 5cc increments. Good needle visualization. Patient tolerated procedure well.

## 2019-12-25 NOTE — H&P (Signed)
TOTAL KNEE ADMISSION H&P  Patient is being admitted for right total knee arthroplasty.  Subjective:  Chief Complaint:right knee pain.  HPI: Tiffany Stout, 54 y.o. female, has a history of pain and functional disability in the right knee due to arthritis and has failed non-surgical conservative treatments for greater than 12 weeks to includeNSAID's and/or analgesics, corticosteriod injections, viscosupplementation injections, use of assistive devices and activity modification.  Onset of symptoms was gradual, starting 3 years ago with gradually worsening course since that time. The patient noted no past surgery on the right knee(s).  Patient currently rates pain in the right knee(s) at 10 out of 10 with activity. Patient has night pain, worsening of pain with activity and weight bearing, pain that interferes with activities of daily living, pain with passive range of motion, crepitus and joint swelling.  Patient has evidence of subchondral sclerosis, periarticular osteophytes and joint space narrowing by imaging studies. There is no active infection.  Patient Active Problem List   Diagnosis Date Noted  . 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 Status post total left knee replacement 06/05/2019  . Unilateral primary osteoarthritis, left knee 04/09/2019  . Unilateral primary osteoarthritis, right knee 04/09/2019  . IBS (irritable bowel syndrome)   . GERD (gastroesophageal reflux disease) 11/08/2011   Past Medical History:  Diagnosis Date  . Anxiety   . Arthritis   . Fibromyalgia   . GERD (gastroesophageal reflux disease)    no meds - diet controlled  . History of blood transfusion    with hysterectomy surgery  . Hypertension    no meds  . IBS (irritable bowel syndrome)     Past Surgical History:  Procedure Laterality Date  . ABDOMINAL HYSTERECTOMY    . CHOLECYSTECTOMY    . ESOPHAGOGASTRODUODENOSCOPY  11/11/2011   Procedure: ESOPHAGOGASTRODUODENOSCOPY  (EGD);  Surgeon: Malissa Hippo, MD;  [facility];  Service: Endoscopy;  Laterality: N/A;  315  . TONSILLECTOMY    . TOTAL KNEE ARTHROPLASTY Left 06/05/2019   Procedure: LEFT TOTAL KNEE ARTHROPLASTY;  Surgeon: Kathryne Hitch, MD;  Location: [facility] OR;  Service: Orthopedics;  Laterality: Left;  . WISDOM TOOTH EXTRACTION     only 2 removed    Current Facility-Administered Medications  Medication Dose Route Frequency Provider Last Rate Last Admin  . ceFAZolin (ANCEF) IVPB 2g/100 mL premix  2 g Intravenous On Call to OR Kirtland Bouchard, PA-C      . fentaNYL (SUBLIMAZE) 100 MCG/2ML injection           . lactated ringers infusion   Intravenous Continuous Lannie Fields, DO 10 mL/hr at 12/25/19 1102 New Bag at 12/25/19 1102  . midazolam (VERSED) 2 MG/2ML injection           . povidone-iodine 10 % swab 2 application  2 application Topical Once Kirtland Bouchard, PA-C      . tranexamic acid (CYKLOKAPRON) IVPB 1,000 mg  1,000 mg Intravenous To OR Kirtland Bouchard, PA-C       Allergies  Allergen Reactions  . Morphine Nausea And Vomiting    nightmares    Social History   Tobacco Use  . Smoking status: Former Smoker    Types: Cigarettes  . Smokeless tobacco: Never Used  . Tobacco comment: Quit 20 years ago  Substance Use Topics  . Alcohol use: No    History reviewed. No pertinent family history.   Review of Systems  Musculoskeletal: Positive for gait  problem and joint swelling.  All other systems reviewed and are negative.   Objective:  Physical Exam Vitals reviewed.  Constitutional:      Appearance: Normal appearance.  HENT:     Head: Normocephalic and atraumatic.  Eyes:     Extraocular Movements: Extraocular movements intact.     Pupils: Pupils are equal, round, and reactive to light.  Cardiovascular:     Rate and Rhythm: Normal rate and regular rhythm.     Pulses: Normal pulses.  Pulmonary:     Effort: Pulmonary effort is normal.     Breath sounds:  Normal breath sounds.  Abdominal:     Palpations: Abdomen is soft.  Musculoskeletal:     Cervical back: Normal range of motion.     Right knee: Effusion and bony tenderness present. Decreased range of motion. Tenderness present over the medial joint line and lateral joint line. Abnormal alignment and abnormal meniscus.  Neurological:     Mental Status: She is alert and oriented to person, place, and time.  Psychiatric:        Behavior: Behavior normal.     Vital signs in last 24 hours: Temp:  [97.7 F (36.5 C)] 97.7 F (36.5 C) (11/16 1039) Pulse Rate:  [80] 80 (11/16 1039) Resp:  [18] 18 (11/16 1039) BP: (154)/(91) 154/91 (11/16 1039) SpO2:  [100 %] 100 % (11/16 1039) Weight:  [56.7 kg] 56.7 kg (11/16 1039)  Labs:   Estimated body mass index is 20.8 kg/m as calculated from the following:   Height as of this encounter: 5\' 5"  (1.651 m).   Weight as of this encounter: 56.7 kg.   Imaging Review Plain radiographs demonstrate severe degenerative joint disease of the right knee(s). The overall alignment ismild varus. The bone quality appears to be good for age and reported activity level.      Assessment/Plan:  End stage arthritis, right knee   The patient history, physical examination, clinical judgment of the provider and imaging studies are consistent with end stage degenerative joint disease of the right knee(s) and total knee arthroplasty is deemed medically necessary. The treatment options including medical management, injection therapy arthroscopy and arthroplasty were discussed at length. The risks and benefits of total knee arthroplasty were presented and reviewed. The risks due to aseptic loosening, infection, stiffness, patella tracking problems, thromboembolic complications and other imponderables were discussed. The patient acknowledged the explanation, agreed to proceed with the plan and consent was signed. Patient is being admitted for inpatient treatment for  surgery, pain control, PT, OT, prophylactic antibiotics, VTE prophylaxis, progressive ambulation and ADL's and discharge planning. The patient is planning to be discharged home with home health services

## 2019-12-25 NOTE — Anesthesia Preprocedure Evaluation (Addendum)
Anesthesia Evaluation  Patient identified by MRN, date of birth, ID band Patient awake    Reviewed: Allergy & Precautions, NPO status , Patient's Chart, lab work & pertinent test results  Airway Mallampati: III  TM Distance: >3 FB Neck ROM: Full  Mouth opening: Limited Mouth Opening  Dental no notable dental hx. (+) Teeth Intact, Dental Advisory Given   Pulmonary former smoker,    Pulmonary exam normal breath sounds clear to auscultation       Cardiovascular hypertension, Normal cardiovascular exam Rhythm:Regular Rate:Normal     Neuro/Psych PSYCHIATRIC DISORDERS Anxiety negative neurological ROS     GI/Hepatic Neg liver ROS, GERD  Controlled,  Endo/Other  negative endocrine ROS  Renal/GU negative Renal ROS  negative genitourinary   Musculoskeletal  (+) Arthritis , Osteoarthritis,  Fibromyalgia -R knee OA   Abdominal Normal abdominal exam  (+)   Peds  Hematology negative hematology ROS (+)   Anesthesia Other Findings Had other knee done 05/2019 under [facility]  Reproductive/Obstetrics negative OB ROS                            Anesthesia Physical Anesthesia Plan  ASA: II  Anesthesia Plan: Regional and General   Post-op Pain Management:    Induction: Intravenous  PONV Risk Score and Plan: 3 and Ondansetron, Dexamethasone, Midazolam and Treatment may vary due to age or medical condition  Airway Management Planned: LMA  Additional Equipment: None  Intra-op Plan:   Post-operative Plan: Extubation in OR  Informed Consent: I have reviewed the patients History and Physical, chart, labs and discussed the procedure including the risks, benefits and alternatives for the proposed anesthesia with the patient or authorized representative who has indicated his/her understanding and acceptance.     Dental advisory given  Plan Discussed with: CRNA  Anesthesia Plan Comments: (Refusing spinal, had  [facility]/LMA with other knee replacement and did well with this. )       Anesthesia Quick Evaluation

## 2019-12-25 NOTE — Transfer of Care (Signed)
Immediate Anesthesia Transfer of Care Note  Patient: Tiffany Stout  Procedure(s) Performed: RIGHT TOTAL KNEE ARTHROPLASTY (Right Knee)  Patient Location: PACU  Anesthesia Type:General  Level of Consciousness: drowsy  Airway & Oxygen Therapy: Patient Spontanous Breathing and Patient connected to face mask oxygen  Post-op Assessment: Report given to RN and Post -op Vital signs reviewed and stable  Post vital signs: Reviewed and stable  Last Vitals:  Vitals Value Taken Time  BP 150/96 12/25/19 1341  Temp    Pulse 108 12/25/19 1343  Resp 16 12/25/19 1343  SpO2 100 % 12/25/19 1343  Vitals shown include unvalidated device data.  Last Pain:  Vitals:   12/25/19 1052  TempSrc:   PainSc: 10-Worst pain ever      Patients Stated Pain Goal: 3 (12/25/19 1052)  Complications: No complications documented.

## 2019-12-25 NOTE — Anesthesia Postprocedure Evaluation (Signed)
Anesthesia Post Note  Patient: Anysia Choi Christopoulos  Procedure(s) Performed: RIGHT TOTAL KNEE ARTHROPLASTY (Right Knee)     Patient location during evaluation: PACU Anesthesia Type: Regional and General Level of consciousness: awake and alert, oriented and patient cooperative Pain management: pain level controlled Vital Signs Assessment: post-procedure vital signs reviewed and stable Respiratory status: spontaneous breathing, nonlabored ventilation and respiratory function stable Cardiovascular status: blood pressure returned to baseline and stable Postop Assessment: no apparent nausea or vomiting Anesthetic complications: no   No complications documented.  Last Vitals:  Vitals:   12/25/19 1426 12/25/19 1441  BP: (!) 152/91 (!) 147/90  Pulse: 96 90  Resp: 14 15  Temp:    SpO2: 97% 92%    Last Pain:  Vitals:   12/25/19 1434  TempSrc:   PainSc: 6                  Lannie Fields

## 2019-12-25 NOTE — Anesthesia Procedure Notes (Signed)
Procedure Name: LMA Insertion Date/Time: 12/25/2019 12:07 PM Performed by: Tressia Miners, CRNA Pre-anesthesia Checklist: Patient identified, Emergency Drugs available, Suction available and Patient being monitored Patient Re-evaluated:Patient Re-evaluated prior to induction Oxygen Delivery Method: Circle System Utilized Preoxygenation: Pre-oxygenation with 100% oxygen Induction Type: IV induction Ventilation: Mask ventilation without difficulty LMA: LMA inserted LMA Size: 3.0 Number of attempts: 1 Airway Equipment and Method: Bite block Placement Confirmation: positive ETCO2 Tube secured with: Tape Dental Injury: Teeth and Oropharynx as per pre-operative assessment  Comments: Initially placed LMA size 4 however not getting good tidal volumes. Switched to a LMA size 3

## 2019-12-25 NOTE — Evaluation (Signed)
Physical Therapy Evaluation Patient Details Name: Tiffany Stout MRN: [id number] DOB: 02-May-1965 Today's Date: 12/25/2019   History of Present Illness  Pt is a 54 y/o female s/p R TKA. PMH includes HTN, L TKA, anxiety, and fibromyalgia.   Clinical Impression  Pt is s/p surgery above with deficits below. Pt limited secondary to pain this session. Also demonstrating anxiety with mobility. Required min A to stand and transfer to chair using RW this session. Educated about knee precautions. Will continue to follow acutely.     Follow Up Recommendations Follow surgeon's recommendation for DC plan and follow-up therapies    Equipment Recommendations  None recommended by PT    Recommendations for Other Services       Precautions / Restrictions Precautions Precautions: Knee Precaution Booklet Issued: No Precaution Comments: Verbally reviewed knee precautions.  Restrictions Weight Bearing Restrictions: Yes RLE Weight Bearing: Weight bearing as tolerated      Mobility  Bed Mobility Overal bed mobility: Needs Assistance Bed Mobility: Supine to Sit     Supine to sit: Min assist     General bed mobility comments: Min A for trunk elevation to come to sitting.     Transfers Overall transfer level: Needs assistance Equipment used: Rolling walker (2 wheeled) Transfers: Sit to/from [employer] Sit to Stand: Min assist Stand pivot transfers: Min assist       General transfer comment: Min A for lift assist and steadying to stand. Cues for safe hand placement. Pt releasing RW and attempting to close gown, despite cues to keep hands on RW and to let PT adjust becuase of R knee instability. Min A to transfer to chair.   Ambulation/Gait                Stairs            Wheelchair Mobility    Modified Rankin (Stroke Patients Only)       Balance Overall balance assessment: Needs assistance Sitting-balance support: No upper extremity supported;Feet  supported Sitting balance-Leahy Scale: Fair     Standing balance support: Bilateral upper extremity supported;During functional activity Standing balance-Leahy Scale: Poor Standing balance comment: Reliant on BUE support                              Pertinent Vitals/Pain Pain Assessment: Faces Faces Pain Scale: Hurts even more Pain Location: R knee Pain Descriptors / Indicators: Aching;Operative site guarding Pain Intervention(s): Limited activity within patient's tolerance;Monitored during session;Repositioned    Home Living Family/patient expects to be discharged to:: Private residence Living Arrangements: Spouse/significant other Available Help at Discharge: Family Type of Home: House Home Access: Stairs to enter Entrance Stairs-Rails: None Entrance Stairs-Number of Steps: 2 Home Layout: One level Home Equipment: Environmental consultant - 2 wheels;Bedside commode      Prior Function Level of Independence: Independent               Hand Dominance        Extremity/Trunk Assessment   Upper Extremity Assessment Upper Extremity Assessment: Overall WFL for tasks assessed    Lower Extremity Assessment Lower Extremity Assessment: RLE deficits/detail RLE Deficits / Details: Deficits consistent with post op pain and weakness.     Cervical / Trunk Assessment Cervical / Trunk Assessment: Normal  Communication   Communication: No difficulties  Cognition Arousal/Alertness: Awake/alert Behavior During Therapy: Anxious Overall Cognitive Status: No family/caregiver present to determine baseline cognitive functioning  General Comments: Pt anxious throughout. Decreased safety awareness noted. Pt letting go of RW to fix gown despite R knee instability and cues to keep hands on RW.       General Comments      Exercises     Assessment/Plan    PT Assessment Patient needs continued PT services  PT Problem List Decreased  strength;Decreased range of motion;Decreased activity tolerance;Decreased balance;Decreased mobility;Decreased knowledge of use of DME;Decreased knowledge of precautions;Pain;Decreased safety awareness       PT Treatment Interventions DME instruction;Gait training;Stair training;Functional mobility training;Therapeutic exercise;Therapeutic activities;Balance training;Patient/family education    PT Goals (Current goals can be found in the Care Plan section)  Acute Rehab PT Goals Patient Stated Goal: to decrease pain  PT Goal Formulation: With patient Time For Goal Achievement: 01/08/20 Potential to Achieve Goals: Good    Frequency 7X/week   Barriers to discharge        Co-evaluation               AM-PAC PT "6 Clicks" Mobility  Outcome Measure Help needed turning from your back to your side while in a flat bed without using bedrails?: None Help needed moving from lying on your back to sitting on the side of a flat bed without using bedrails?: A Little Help needed moving to and from a bed to a chair (including a wheelchair)?: A Little Help needed standing up from a chair using your arms (e.g., wheelchair or bedside chair)?: A Little Help needed to walk in hospital room?: A Little Help needed climbing 3-5 steps with a railing? : A Lot 6 Click Score: 18    End of Session Equipment Utilized During Treatment: Gait belt Activity Tolerance: Patient limited by pain Patient left: in chair;with call bell/phone within reach Nurse Communication: Mobility status PT Visit Diagnosis: Unsteadiness on feet (R26.81);Muscle weakness (generalized) (M62.81);Pain Pain - Right/Left: Right Pain - part of body: Knee    Time: 4268-3419 PT Time Calculation (min) (ACUTE ONLY): 20 min   Charges:   PT Evaluation $PT Eval Low Complexity: 1 Low          Cindee Salt, DPT  Acute Rehabilitation Services  Pager: [phone] Office: [phone]   Tiffany Stout 12/25/2019,  5:07 PM

## 2019-12-25 NOTE — Op Note (Signed)
NAME: Menter, SHAUNTEA LOK MEDICAL RECORD [id number] ACCOUNT [id number] DATE OF BIRTH:09-26-1965 FACILITY: [facility] LOCATION: [facility] PHYSICIAN:Josian Lanese Aretha Parrot, MD  OPERATIVE REPORT  DATE OF PROCEDURE:  12/25/2019  PREOPERATIVE DIAGNOSIS:  Primary osteoarthritis and degenerative joint disease, right knee.  POSTOPERATIVE DIAGNOSIS:  Primary osteoarthritis and degenerative joint disease, right knee.  PROCEDURE:  Right total knee arthroplasty.  IMPLANTS:  Stryker Triathlon press-fit knee system with size 3 femur, size 3 tibial tray, 10 mm fixed bearing polyethylene insert, size 29 patellar button.  SURGEON:  Doneen Poisson, MD  ASSISTANT:  Richardean Canal, PA-C  ANESTHESIA: 1.  General. 2.  Right lower extremity adductor canal block. 3.  Local with Marcaine mixed with epinephrine around the arthrotomy .  ANTIBIOTICS:  2 g IV Ancef.  ESTIMATED BLOOD LOSS:  50 mL.  TOURNIQUET TIME:  Less than 45 minutes.  COMPLICATIONS:  None.  INDICATIONS:  The patient is a 54 year old active female with bilateral knee osteoarthritis.  We actually replaced her left knee earlier this year.  Her right knee shows severe end-stage arthritis on x-rays, and this has been debilitating in terms of the  pain it is causing her.  She has decreased mobility, decreased quality of life, and decrease in activities of daily living as a result of her right knee osteoarthritis.  At this point, having the successful left total knee arthroplasty, she wishes to  proceed with this on the right side.  Having had this before, she is fully aware of the risk of acute blood loss anemia, nerve or vessel injury, fracture, infection, DVT, and implant failure.  She understands our goals are to decrease pain, improve  mobility, and overall improve quality of life.  DESCRIPTION OF PROCEDURE:  After informed consent was obtained, appropriate right knee was marked.  An adductor canal block was obtained in the holding room.   She was then brought to the operating room and placed supine on the operating table.  General  anesthesia was then obtained.  A nonsterile tourniquet was placed around her upper right thigh, and her right thigh, knee, leg, ankle, and foot were prepped and draped with DuraPrep and sterile drapes including a sterile stockinette.  Timeout was called  to identify correct patient, correct right knee.  We then used an Esmarch to wrap that leg and tourniquet was inflated to 250 mm of pressure.  I then made a direct midline incision over the patella and carried this proximally and distally.  We dissected  down the knee joint, carried out medial parapatellar arthrotomy finding a moderate joint effusion and significant periarticular osteophytes in all 3 compartments.  With the knee in a flexed position, we found complete denuding of the cartilage at the  medial femoral condyle and medial tibial plateau as well as the trochlea groove and the undersurface of the patella.  We removed remnants of ACL, PCL, medial and lateral meniscus.  With the extramedullary cutting guide, we set this for taking 2 mm off  the high side, correcting varus and valgus in a neutral slope for our proximal tibia cut.  We made this cut without difficulty.  We then used an intramedullary drill for placing our alignment rod down the femur for making our distal femoral cut, setting  this for right knee at 5 degrees externally rotated for an 8 mm distal femoral cut.  We made this cut without difficulty and brought the knee back down to full extension and achieved full extension with a 9 mm extension block.  We then  back to the femur  and put our femoral sizing guide based off the epicondylar axis.  Based off this, we chose a size 3 femur and this corresponded with her other side.  We put a 4-in-1 cutting block for a size 3 femur, made our anterior and posterior cuts, followed by our  chamfer cuts.  We then made our femoral box cut.  Attention was  then turned back to the tibia.  We chose a size 3 tibial tray for coverage, and this corresponded with her other tibia from her left knee.  We set the rotation off the tibial tubercle and  the femur and then made our keel punch off of this.  With the size 3 trial tibia, we trialed the size 3 right femur and placed our 9 mm fixed bearing polyethylene insert.  We were pleased with range of motion and stability, but did feel like the final  implant should probably be a 10 mm insert.  We then cut our patella and drilled 3 holes for a size 29 patellar button.  We then removed all instrumentation from the knee and irrigated the knee with normal saline solution using pulse lavage.  We then  dried the knee real well and placed our Marcaine with epinephrine mixture around the arthrotomy.  We then dried the knee again with the knee in a flexed position, placed our real Stryker press-fit tibial tray size 3 followed by real size 3 press-fit  right femur.  We placed our real 10 mm fixed bearing polyethylene insert and press-fit our patellar button.  We put the knee through several cycles of range of motion.  I was pleased with range of motion and stability.  We then let the tourniquet down.   Hemostasis obtained with electrocautery.  We closed the arthrotomy with interrupted #1 Vicryl suture followed by closing the deep tissue with 0 Vicryl and 2-0 Vicryl was used to close subcutaneous tissue.  The skin was closed with staples.  Xeroform and  a well-padded sterile dressing was applied.  The patient was awakened, extubated, and taken to recovery room in stable condition with all final counts being correct.  No complications noted.  Of note, Richardean Canal, PA-C, assisted during the entire case, and assistance was crucial for facilitating all aspects of this case.  IN/NUANCE  D:12/25/2019 T:12/25/2019 JOB:[id number]

## 2019-12-25 NOTE — Brief Op Note (Signed)
12/25/2019  1:17 PM  PATIENT:  Tiffany Stout  54 y.o. female  PRE-OPERATIVE DIAGNOSIS:  right knee osteoarthritis  POST-OPERATIVE DIAGNOSIS:  right knee osteoarthritis  PROCEDURE:  Procedure(s): RIGHT TOTAL KNEE ARTHROPLASTY (Right)  SURGEON:  Surgeon(s) and Role:    Kathryne Hitch, MD - Primary  PHYSICIAN ASSISTANT:  Rexene Edison, PA-C  ANESTHESIA:   local, regional and general  COUNTS:  YES  TOURNIQUET:   Total Tourniquet Time Documented: Thigh (Right) - 40 minutes Total: Thigh (Right) - 40 minutes   DICTATION: .Other Dictation: Dictation Number [id number]  PLAN OF CARE: Admit for overnight observation  PATIENT DISPOSITION:  PACU - hemodynamically stable.   Delay start of Pharmacological VTE agent (>24hrs) due to surgical blood loss or risk of bleeding: no

## 2019-12-26 ENCOUNTER — Encounter (HOSPITAL_COMMUNITY): Payer: Self-pay | Admitting: Orthopaedic Surgery

## 2019-12-26 DIAGNOSIS — M1711 Unilateral primary osteoarthritis, right knee: Secondary | ICD-10-CM | POA: Diagnosis not present

## 2019-12-26 LAB — BASIC METABOLIC PANEL
Anion gap: 8 (ref 5–15)
BUN: 6 mg/dL (ref 6–20)
CO2: 26 mmol/L (ref 22–32)
Calcium: 8.6 mg/dL — ABNORMAL LOW (ref 8.9–10.3)
Chloride: 103 mmol/L (ref 98–111)
Creatinine, Ser: 0.76 mg/dL (ref 0.44–1.00)
GFR, Estimated: >60 mL/min (ref >60)
Glucose, Bld: 196 mg/dL — ABNORMAL HIGH (ref 70–99)
Potassium: 3.6 mmol/L (ref 3.5–5.1)
Sodium: 137 mmol/L (ref 135–145)

## 2019-12-26 LAB — CBC
HCT: 33.8 % — ABNORMAL LOW (ref 36.0–46.0)
Hemoglobin: 10.8 g/dL — ABNORMAL LOW (ref 12.0–15.0)
MCH: 30.3 pg (ref 26.0–34.0)
MCHC: 32 g/dL (ref 30.0–36.0)
MCV: 94.9 fL (ref 80.0–100.0)
Platelets: 197 10*3/uL (ref 150–400)
RBC: 3.56 MIL/uL — ABNORMAL LOW (ref 3.87–5.11)
RDW: 13.2 % (ref 11.5–15.5)
WBC: 10.7 10*3/uL — ABNORMAL HIGH (ref 4.0–10.5)
nRBC: 0 % (ref 0.0–0.2)

## 2019-12-26 MED ORDER — ASPIRIN 81 MG PO CHEW: 81.0000 mg | CHEWABLE_TABLET | Freq: Two times a day (BID) | ORAL | 0 refills | Status: AC

## 2019-12-26 MED ORDER — METHOCARBAMOL 500 MG PO TABS: 500.0000 mg | ORAL_TABLET | Freq: Four times a day (QID) | ORAL | 1 refills | Status: AC | PRN

## 2019-12-26 MED ORDER — GABAPENTIN 100 MG PO CAPS: 100.0000 mg | ORAL_CAPSULE | Freq: Three times a day (TID) | ORAL | 1 refills | Status: AC

## 2019-12-26 MED ORDER — OXYCODONE HCL 5 MG PO TABS
5.0000 mg | ORAL_TABLET | ORAL | 0 refills | Status: DC | PRN
Start: 2019-12-26 — End: 2020-01-07

## 2019-12-26 NOTE — Discharge Instructions (Signed)

## 2019-12-26 NOTE — Progress Notes (Signed)
Physical Therapy Treatment Patient Details Name: Tiffany Stout MRN: [id number] DOB: 1966-01-30 Today's Date: 12/26/2019    History of Present Illness Pt is a 54 y/o female s/p R TKA. PMH includes HTN, L TKA, anxiety, and fibromyalgia.     PT Comments    Patient received in bed asleep, easily woken and eager to participate in therapy. Able to mobilize on a S-min guard level with RW and KI applied for safety/to prevent knee buckling, and tolerated great progression of activity and gait distance today. Needed MinA for stair management with RW and will benefit from review of this in the afternoon before DC. Able to toilet with S-min guard and cues for safety. Extremely impulsive today and very poor safety awareness, perseverating on random tasks around her room despite constant cues for safety from therapist. Would benefit from 24/7S if able at home. Left up in recliner with all needs met, nursing staff aware of patient status.    Follow Up Recommendations  Follow surgeon's recommendation for DC plan and follow-up therapies     Equipment Recommendations  None recommended by PT    Recommendations for Other Services       Precautions / Restrictions Precautions Precautions: Knee Precaution Booklet Issued: No Precaution Comments: Verbally reviewed knee precautions.  Required Braces or Orthoses: Knee Immobilizer - Right Knee Immobilizer - Right: On when out of bed or walking Restrictions Weight Bearing Restrictions: Yes RLE Weight Bearing: Weight bearing as tolerated    Mobility  Bed Mobility Overal bed mobility: Needs Assistance Bed Mobility: Supine to Sit     Supine to sit: Supervision     General bed mobility comments: S for safety, increased time and effort  Transfers Overall transfer level: Needs assistance Equipment used: Rolling walker (2 wheeled) Transfers: Sit to/from Stand Sit to Stand: Min guard         General transfer comment: min guard for safety/cues for  hand placement and steadying; impulsive and needs repeated cues for hand placement and safety  Ambulation/Gait Ambulation/Gait assistance: Min guard Gait Distance (Feet): 100 Feet Assistive device: Rolling walker (2 wheeled) Gait Pattern/deviations: Step-through pattern;Decreased step length - left;Decreased stance time - right;Decreased weight shift to right;Trendelenburg Gait velocity: decreased   General Gait Details: did a great job with heel strike/upright posture- needs cues for sequencing and safety to slow down. No knee buckling noted with KI on.   Stairs Stairs: Yes Stairs assistance: Min assist Stair Management: No rails;Backwards Number of Stairs: 2 General stair comments: backwards descent/forwards ascent with RW, MinA for RW management and balance, cues for sequencing   Wheelchair Mobility    Modified Rankin (Stroke Patients Only)       Balance Overall balance assessment: Needs assistance Sitting-balance support: No upper extremity supported;Feet supported Sitting balance-Leahy Scale: Good     Standing balance support: Bilateral upper extremity supported;During functional activity Standing balance-Leahy Scale: Poor Standing balance comment: Reliant on BUE support                             Cognition Arousal/Alertness: Awake/alert Behavior During Therapy: Restless Overall Cognitive Status: No family/caregiver present to determine baseline cognitive functioning                                 General Comments: less anxious today but very easily distracted in general- very poor safety awareness, pushing RW off to the  side and perseverating on random tasks like trying to gather clothing from opposite side of the bed. Needs almost constant cues to maintain safety.      Exercises      General Comments        Pertinent Vitals/Pain Pain Assessment: 0-10 Pain Score: 0-No pain Pain Intervention(s): Limited activity within patient's  tolerance;Monitored during session    Home Living                      Prior Function            PT Goals (current goals can now be found in the care plan section) Acute Rehab PT Goals Patient Stated Goal: to decrease pain  PT Goal Formulation: With patient Time For Goal Achievement: 01/08/20 Potential to Achieve Goals: Good Progress towards PT goals: Progressing toward goals    Frequency    7X/week      PT Plan      Co-evaluation              AM-PAC PT "6 Clicks" Mobility   Outcome Measure  Help needed turning from your back to your side while in a flat bed without using bedrails?: None Help needed moving from lying on your back to sitting on the side of a flat bed without using bedrails?: None Help needed moving to and from a bed to a chair (including a wheelchair)?: A Little Help needed standing up from a chair using your arms (e.g., wheelchair or bedside chair)?: A Little Help needed to walk in hospital room?: A Little Help needed climbing 3-5 steps with a railing? : A Little 6 Click Score: 20    End of Session Equipment Utilized During Treatment: Gait belt Activity Tolerance: Patient tolerated treatment well Patient left: in chair;with call bell/phone within reach Nurse Communication: Mobility status;Other (comment) (impulsivity) PT Visit Diagnosis: Unsteadiness on feet (R26.81);Muscle weakness (generalized) (M62.81);Pain Pain - Right/Left: Right Pain - part of body: Knee     Time: 6962-9528 PT Time Calculation (min) (ACUTE ONLY): 28 min  Charges:  $Gait Training: 8-22 mins $Therapeutic Activity: 8-22 mins                     Windell Norfolk, DPT, PN1   Supplemental Physical Therapist [place]    Pager [phone] Acute Rehab Office [phone]

## 2019-12-26 NOTE — Progress Notes (Signed)
Physical Therapy Treatment Patient Details Name: Tiffany Stout MRN: [id number] DOB: [birth year] Today's Date: 12/26/2019    History of Present Illness Pt is a 54 y/o female s/p R TKA. PMH includes HTN, L TKA, anxiety, and fibromyalgia.     PT Comments    Patient received in bed asleep, easily woken and excited for second therapy sesson. R knee ROM quite limited due to pain, estimate tolerated AROM to be about 15 degrees extension to 35-40 degrees flexion this afternoon. Introduced and practiced appropriate TKR exercises (ankle pumps, quad sets, hip ADD, heel slides, SAQs, LAQs, SLRs with quad set, and seated knee flexion assisted and not assisted). Continued gait training but needed more cues for gait pattern due to increased antalgia this afternoon. Repeated stair training and while she did need occasional cues, demonstrated fair carryover from this morning. Provided with all appropriate handouts for exercises and step navigation with RW. Left sitting at EOB with RN attending, all needs otherwise met.    Follow Up Recommendations  Follow surgeon's recommendation for DC plan and follow-up therapies     Equipment Recommendations  None recommended by PT    Recommendations for Other Services       Precautions / Restrictions Precautions Precautions: Knee Precaution Booklet Issued: No Precaution Comments: Verbally reviewed knee precautions.  Required Braces or Orthoses: Knee Immobilizer - Right Knee Immobilizer - Right: On when out of bed or walking Restrictions Weight Bearing Restrictions: No RLE Weight Bearing: Weight bearing as tolerated    Mobility  Bed Mobility Overal bed mobility: Needs Assistance Bed Mobility: Supine to Sit     Supine to sit: Supervision     General bed mobility comments: S for safety, increased time and effort  Transfers Overall transfer level: Needs assistance Equipment used: Rolling walker (2 wheeled) Transfers: Sit to/from Stand Sit to Stand:  Min guard         General transfer comment: min guard for safety/cues for hand placement and steadying; less impulsive but still needs cues for hand placement/safety  Ambulation/Gait Ambulation/Gait assistance: Min guard Gait Distance (Feet): 100 Feet Assistive device: Rolling walker (2 wheeled) Gait Pattern/deviations: Step-through pattern;Decreased step length - left;Decreased stance time - right;Decreased weight shift to right;Trendelenburg Gait velocity: decreased   General Gait Details: having more pain this afternoon, needed more cues for heel strike/step through pattern, did much better with step through pattern this afternoon. No knee buckling noted outside of [place]!   Stairs Stairs: Yes Stairs assistance: Min assist Stair Management: No rails;Backwards Number of Stairs: 4 General stair comments: backwards descent/forwards ascent with RW, MinA for RW management and balance, cues for sequencing   Wheelchair Mobility    Modified Rankin (Stroke Patients Only)       Balance Overall balance assessment: Needs assistance Sitting-balance support: No upper extremity supported;Feet supported Sitting balance-Leahy Scale: Good     Standing balance support: Bilateral upper extremity supported;During functional activity Standing balance-Leahy Scale: Poor Standing balance comment: Reliant on BUE support                             Cognition Arousal/Alertness: Awake/alert Behavior During Therapy: Restless Overall Cognitive Status: No family/caregiver present to determine baseline cognitive functioning                                 General Comments: still very easily distracted and with poor safety awareness,  still perseverating on random tasks and still needing high level of cues for safety...but slightly less impulsive this afternoon      Exercises Total Joint Exercises Goniometric ROM: estimate R knee ROM no better than 15 degrees extension, and  no greater than about 35-40 degrees flexion pain limited    General Comments        Pertinent Vitals/Pain Pain Assessment: Faces Faces Pain Scale: Hurts even more Pain Location: R knee Pain Descriptors / Indicators: Aching;Operative site guarding Pain Intervention(s): Limited activity within patient's tolerance;Monitored during session;Patient requesting pain meds-RN notified    Home Living                      Prior Function            PT Goals (current goals can now be found in the care plan section) Acute Rehab PT Goals Patient Stated Goal: to decrease pain  PT Goal Formulation: With patient Time For Goal Achievement: 01/08/20 Potential to Achieve Goals: Good Progress towards PT goals: Progressing toward goals    Frequency    7X/week      PT Plan Current plan remains appropriate    Co-evaluation              AM-PAC PT "6 Clicks" Mobility   Outcome Measure  Help needed turning from your back to your side while in a flat bed without using bedrails?: None Help needed moving from lying on your back to sitting on the side of a flat bed without using bedrails?: None Help needed moving to and from a bed to a chair (including a wheelchair)?: A Little Help needed standing up from a chair using your arms (e.g., wheelchair or bedside chair)?: A Little Help needed to walk in hospital room?: A Little Help needed climbing 3-5 steps with a railing? : A Little 6 Click Score: 20    End of Session Equipment Utilized During Treatment: Gait belt Activity Tolerance: Patient tolerated treatment well Patient left: in bed;with call bell/phone within reach (sitting at EOB per her request) Nurse Communication: Mobility status;Patient requests pain meds PT Visit Diagnosis: Unsteadiness on feet (R26.81);Muscle weakness (generalized) (M62.81);Pain Pain - Right/Left: Right Pain - part of body: Knee     Time: 7412-8786 PT Time Calculation (min) (ACUTE ONLY): 28  min  Charges:  $Gait Training: 8-22 mins $Therapeutic Exercise: 8-22 mins                     Windell Norfolk, DPT, PN1   Supplemental Physical Therapist [place]    Pager [phone] Acute Rehab Office [phone]

## 2019-12-26 NOTE — Progress Notes (Signed)
Subjective: 1 Day Post-Op Procedure(s) (LRB): RIGHT TOTAL KNEE ARTHROPLASTY (Right) Patient reports pain as moderate.  Does state that she feels her right knee is week and wants to give way when she is up.  Encouraged use of knee immobilizer and gave her reassurance.  Objective: Vital signs in last 24 hours: Temp:  [97.5 F (36.4 C)-98.1 F (36.7 C)] 97.8 F (36.6 C) (11/17 0339) Pulse Rate:  [78-109] 82 (11/17 0339) Resp:  [11-18] 18 (11/17 0339) BP: (102-179)/(64-102) 103/73 (11/17 0339) SpO2:  [92 %-100 %] 98 % (11/17 0339) Weight:  [56.7 kg] 56.7 kg (11/16 1039)  Intake/Output from previous day: 11/16 0701 - 11/17 0700 In: 1100 [I.V.:1000; IV Piggyback:100] Out: 350 [Urine:300; Blood:50] Intake/Output this shift: No intake/output data recorded.  Recent Labs    12/26/19 0420  HGB 10.8*   Recent Labs    12/26/19 0420  WBC 10.7*  RBC 3.56*  HCT 33.8*  PLT 197   Recent Labs    12/26/19 0420  NA 137  K 3.6  CL 103  CO2 26  BUN 6  CREATININE 0.76  GLUCOSE 196*  CALCIUM 8.6*   No results for input(s): LABPT, INR in the last 72 hours.  Sensation intact distally Intact pulses distally Dorsiflexion/Plantar flexion intact Incision: scant drainage No cellulitis present Compartment soft   Assessment/Plan: 1 Day Post-Op Procedure(s) (LRB): RIGHT TOTAL KNEE ARTHROPLASTY (Right) Up with therapy Discharge home with home health    Patient's anticipated LOS is less than 2 midnights, meeting these requirements: - Younger than 5 - Lives within 1 hour of care - Has a competent adult at home to recover with post-op recover - NO history of  - Chronic pain requiring opiods  - Diabetes  - Coronary Artery Disease  - Heart failure  - Heart attack  - Stroke  - DVT/VTE  - Cardiac arrhythmia  - Respiratory Failure/COPD  - Renal failure  - Anemia  - Advanced Liver disease       Kathryne Hitch 12/26/2019, 7:56 AM

## 2019-12-26 NOTE — Discharge Summary (Signed)
Patient ID: Tiffany Stout MRN: [id number] DOB/AGE: Jun 24, 1965 54 y.o.  Admit date: 12/25/2019 Discharge date: 12/26/2019  Admission Diagnoses:  Principal Problem:   Unilateral primary osteoarthritis, right knee Active Problems:   Status post total right knee replacement   Discharge Diagnoses:  Same  Past Medical History:  Diagnosis Date  . Anxiety   . Arthritis   . Fibromyalgia   . GERD (gastroesophageal reflux disease)    no meds - diet controlled  . History of blood transfusion    with hysterectomy surgery  . Hypertension    no meds  . IBS (irritable bowel syndrome)     Surgeries: Procedure(s): RIGHT TOTAL KNEE ARTHROPLASTY on 12/25/2019   Consultants:   Discharged Condition: Improved  Hospital Course: Shaquoya Cosper Gervasi is an 53 y.o. female who was admitted 12/25/2019 for operative treatment ofUnilateral primary osteoarthritis, right knee. Patient has severe unremitting pain that affects sleep, daily activities, and work/hobbies. After pre-op clearance the patient was taken to the operating room on 12/25/2019 and underwent  Procedure(s): RIGHT TOTAL KNEE ARTHROPLASTY.    Patient was given perioperative antibiotics:  Anti-infectives (From admission, onward)   Start     Dose/Rate Route Frequency Ordered Stop   12/25/19 1800  ceFAZolin (ANCEF) IVPB 1 g/50 mL premix        1 g 100 mL/hr over 30 Minutes Intravenous Every 6 hours 12/25/19 1511 12/25/19 2346   12/25/19 1030  ceFAZolin (ANCEF) IVPB 2g/100 mL premix        2 g 200 mL/hr over 30 Minutes Intravenous On call to O.R. 12/25/19 1022 12/25/19 1218       Patient was given sequential compression devices, early ambulation, and chemoprophylaxis to prevent DVT.  Patient benefited maximally from hospital stay and there were no complications.    Recent vital signs:  Patient Vitals for the past 24 hrs:  BP Temp Temp src Pulse Resp SpO2 Height Weight  12/26/19 0339 103/73 97.8 F (36.6 C) Oral 82 18 98 % -- --   12/25/19 2306 102/64 97.8 F (36.6 C) Oral 80 18 97 % -- --  12/25/19 1921 128/80 98.1 F (36.7 C) Oral 85 18 100 % -- --  12/25/19 1516 (!) 147/88 -- -- 93 18 95 % -- --  12/25/19 1456 (!) 152/97 (!) 97.5 F (36.4 C) -- 92 16 94 % -- --  12/25/19 1441 (!) 147/90 -- -- 90 15 92 % -- --  12/25/19 1426 (!) 152/91 -- -- 96 14 97 % -- --  12/25/19 1411 (!) 155/90 -- -- 100 18 100 % -- --  12/25/19 1356 (!) 147/93 -- -- 97 16 100 % -- --  12/25/19 1341 (!) 150/96 97.7 F (36.5 C) -- (!) 109 13 100 % -- --  12/25/19 1146 -- -- -- 78 14 100 % -- --  12/25/19 1145 -- -- -- 79 18 100 % -- --  12/25/19 1144 -- -- -- 82 11 100 % -- --  12/25/19 1143 -- -- -- 85 16 100 % -- --  12/25/19 1142 121/85 -- -- 79 14 99 % -- --  12/25/19 1141 -- -- -- 80 13 100 % -- --  12/25/19 1140 -- -- -- 86 15 100 % -- --  12/25/19 1139 -- -- -- 86 15 100 % -- --  12/25/19 1138 -- -- -- 78 12 100 % -- --  12/25/19 1137 (!) 148/94 -- -- 80 17 100 % -- --  12/25/19 1135 -- -- --  80 14 100 % -- --  12/25/19 1134 -- -- -- 82 15 100 % -- --  12/25/19 1133 -- -- -- 83 14 100 % -- --  12/25/19 1132 (!) 179/102 -- -- 82 13 100 % -- --  12/25/19 1131 -- -- -- 80 18 100 % -- --  12/25/19 1039 (!) 154/91 97.7 F (36.5 C) Temporal 80 18 100 % 5\' 5"  (1.651 m) 56.7 kg     Recent laboratory studies:  Recent Labs    12/26/19 0420  WBC 10.7*  HGB 10.8*  HCT 33.8*  PLT 197  NA 137  K 3.6  CL 103  CO2 26  BUN 6  CREATININE 0.76  GLUCOSE 196*  CALCIUM 8.6*     Discharge Medications:   Allergies as of 12/26/2019      Reactions   Morphine Nausea And Vomiting   nightmares      Medication List    TAKE these medications   aspirin 81 MG chewable tablet Chew 1 tablet (81 mg total) by mouth 2 (two) times daily.   CALCIUM 600 + D PO Take 1 tablet by mouth daily.   clonazePAM 1 MG tablet Commonly known as: KLONOPIN Take 1 mg by mouth at bedtime as needed (sleep).   FLUoxetine 20 MG capsule Commonly  known as: PROZAC Take 20 mg by mouth daily.   gabapentin 100 MG capsule Commonly known as: NEURONTIN Take 1 capsule (100 mg total) by mouth 3 (three) times daily.   ibuprofen 200 MG tablet Commonly known as: ADVIL Take 600-800 mg by mouth every 6 (six) hours as needed for moderate pain.   methocarbamol 500 MG tablet Commonly known as: ROBAXIN Take 1 tablet (500 mg total) by mouth every 6 (six) hours as needed for muscle spasms.   oxyCODONE 5 MG immediate release tablet Commonly known as: Oxy IR/ROXICODONE Take 1-2 tablets (5-10 mg total) by mouth every 4 (four) hours as needed for moderate pain (pain score 4-6).            Durable Medical Equipment  (From admission, onward)         Start     Ordered   12/25/19 1512  DME 3 n 1  Once        12/25/19 1511   12/25/19 1512  DME Walker rolling  Once       Question Answer Comment  Walker: With 5 Inch Wheels   Patient needs a walker to treat with the following condition Status post total right knee replacement      12/25/19 1511          Diagnostic Studies: DG Knee Right Port  Result Date: 12/25/2019 CLINICAL DATA:  Post knee replacement EXAM: PORTABLE RIGHT KNEE - 1-2 VIEW COMPARISON:  None. FINDINGS: There are postoperative changes right total knee arthroplasty. Postoperative soft tissue swelling and air present. No evidence of complication. IMPRESSION: Standard postoperative appearance of right total knee arthroplasty. Electronically Signed   By: 12/27/2019 M.D.   On: 12/25/2019 14:47    Disposition: Discharge disposition: 01-Home or Self Care          Follow-up Information    12/27/2019, MD Follow up in 2 week(s).   Specialty: Orthopedic Surgery Contact information: [address] [place] [phone]                Signed: [phone] 12/26/2019, 7:58 AM

## 2019-12-26 NOTE — Progress Notes (Signed)
Patient alert and oriented, mae's well, voiding adequate amount of urine, swallowing without difficulty, no c/o pain at time of discharge. Patient discharged home with family. Script and discharged instructions given to patient. Patient and family stated understanding of instructions given. Patient has an appointment with Dr. Blackman   

## 2019-12-31 ENCOUNTER — Telehealth: Payer: Self-pay

## 2019-12-31 NOTE — Telephone Encounter (Signed)
Patient called in for refill on oxycodone .   Patient also wanted to speak about getting into nursing home for short stay . Says she is having a hard time bouncing back . The facility will be sending over information soon wanted dr Magnus Ivan to be on the lookout .

## 2020-01-01 ENCOUNTER — Telehealth: Payer: Self-pay | Admitting: Orthopaedic Surgery

## 2020-01-01 NOTE — Telephone Encounter (Signed)
Patient called stating she signed herself out of the [facility] last night and wanted Dr. Magnus Ivan to be aware. Patient asked if she can get (PT) started at home. Patient said [facility] told her to contact them and they will come out to start therapy. Patient said she the pain is off and on through out the day and can be intense at times especially during the night. The number to contact patient is [phone]

## 2020-01-01 NOTE — Telephone Encounter (Signed)
Spoke with [facility] and they will resume HHPT

## 2020-01-07 ENCOUNTER — Ambulatory Visit (INDEPENDENT_AMBULATORY_CARE_PROVIDER_SITE_OTHER): Payer: No Typology Code available for payment source | Admitting: Orthopaedic Surgery

## 2020-01-07 ENCOUNTER — Encounter: Payer: Self-pay | Admitting: Orthopaedic Surgery

## 2020-01-07 DIAGNOSIS — Z96651 Presence of right artificial knee joint: Secondary | ICD-10-CM

## 2020-01-07 MED ORDER — OXYCODONE HCL 5 MG PO TABS
5.0000 mg | ORAL_TABLET | ORAL | 0 refills | Status: DC | PRN
Start: 2020-01-07 — End: 2020-01-10

## 2020-01-07 NOTE — Progress Notes (Signed)
The patient is 2 weeks tomorrow status post a right total knee arthroplasty.  She reports that she is doing well.  The notes from home therapy states that they have been able to flex her to 94 degrees with the right knee.  She is ambulate with a walker.  On exam her staples have been removed from the right knee and Steri-Strips applied.  Her calf is soft.  She lacks full extension by about 3 to 5 degrees.  I can flex her to just past 90 degrees.  There is a moderate effusion with the right operative knee.  I will refill her oxycodone.  We will give her a prescription for outpatient physical therapy in [facility].  I would like to see her back in 4 weeks for repeat exam but no x-rays are needed.

## 2020-01-08 ENCOUNTER — Inpatient Hospital Stay: Payer: No Typology Code available for payment source | Admitting: Orthopaedic Surgery

## 2020-01-10 ENCOUNTER — Other Ambulatory Visit: Payer: Self-pay | Admitting: Orthopaedic Surgery

## 2020-01-10 ENCOUNTER — Telehealth: Payer: Self-pay

## 2020-01-10 MED ORDER — OXYCODONE HCL 5 MG PO TABS
5.0000 mg | ORAL_TABLET | ORAL | 0 refills | Status: DC | PRN
Start: 2020-01-10 — End: 2020-01-15

## 2020-01-10 NOTE — Telephone Encounter (Signed)
Patient called she is requesting rx refill for oxycodone she stated she is having to take 2 at a time due to being in pain she stated she wont have enough for the weekend. [phone]

## 2020-01-11 ENCOUNTER — Telehealth: Payer: Self-pay | Admitting: Orthopaedic Surgery

## 2020-01-11 NOTE — Telephone Encounter (Signed)
Blackman patient 

## 2020-01-11 NOTE — Telephone Encounter (Signed)
Patient called requesting a refill of oxycodone. Please send to pharmacy on file. Please call patient when sent in at [phone].

## 2020-01-11 NOTE — Telephone Encounter (Signed)
FYI.  I have submitted prior auth through Cover My Meds.

## 2020-01-11 NOTE — Telephone Encounter (Signed)
I called patient and advised Dr. Magnus Ivan sent in medication yesterday.  She states that she called pharmacy and they do not have it.  I called [facility] and spoke with Eddie. He states that her insurance only approves #2 prescriptions in a 60 day period.  She was written a prescription for Oxycodone on 12/26/2019 for #30 and on 01/07/2020 for #30.  He advised that he can fill the rx, however, patient will have to pay out of pocket or wait for me to do a prior authorization on medication to see if I can get override. I explained that patient is post op and will need medication. He is willing to give to her for cash price of $17.28 due to her circumstances. I asked that he fax over prior auth paperwork in case patient would like to wait for that decision. He will fax to our office.   I called patient who is going to pay cash price today but would like for me to do prior auth as well as she feels that she will need more medication once she starts outpatient PT next week. I explained that I would be glad to do prior auth, but that she would need to be mindful that this may not pass and she would have to pay out of pocket for the medication. Advised to take medication as needed, however, if she does not need 2 tablets every 4 hours to take less. She expressed understanding.  Tammy-could you please give me prior auth form form [employer] once you receive it? Thanks.

## 2020-01-11 NOTE — Telephone Encounter (Signed)
sure

## 2020-01-14 NOTE — Telephone Encounter (Signed)
Tiffany Stout (Key: [id number]) - [id number] oxyCODONE HCl 5MG  tablets Status: PA Response - Approved Created: December 3rd, 2021 [phone] 07-26-1981

## 2020-01-15 ENCOUNTER — Telehealth: Payer: Self-pay | Admitting: Orthopaedic Surgery

## 2020-01-15 MED ORDER — OXYCODONE HCL 5 MG PO TABS
5.0000 mg | ORAL_TABLET | ORAL | 0 refills | Status: DC | PRN
Start: 2020-01-15 — End: 2020-01-20

## 2020-01-15 NOTE — Telephone Encounter (Signed)
Pt called stating that she needs another refill of her oxycodone.  Pt states that the pain is bad at night and she takes 2 oxycodone at night due to the pain being so bad.   Pt kept repeating that she can't take the pain and that Dr. Magnus Ivan knows that she can't handle the pain.

## 2020-01-15 NOTE — Telephone Encounter (Signed)
Please advise 

## 2020-01-16 ENCOUNTER — Encounter: Payer: Self-pay | Admitting: Orthopaedic Surgery

## 2020-01-16 ENCOUNTER — Telehealth: Payer: Self-pay

## 2020-01-16 DIAGNOSIS — Z96651 Presence of right artificial knee joint: Secondary | ICD-10-CM

## 2020-01-16 NOTE — Telephone Encounter (Signed)
This patient needs an ultrasound on Thursday of her right calf to rule out a DVT.

## 2020-01-16 NOTE — Telephone Encounter (Signed)
Fleet Contras, PT with [facility] called stating that patient is having severe pain in her right calf and having a hard time walking.  Had right knee surgery on 12/25/2019.  CB# [phone].  Please advise.  Thank you.

## 2020-01-17 ENCOUNTER — Telehealth: Payer: Self-pay | Admitting: Orthopaedic Surgery

## 2020-01-17 ENCOUNTER — Other Ambulatory Visit: Payer: Self-pay

## 2020-01-17 NOTE — Telephone Encounter (Signed)
Pt called stating her PT called on her behalf in regards to her R calf and pt would like a CB to further discuss the plans for her pain  [phone]

## 2020-01-17 NOTE — Telephone Encounter (Signed)
I called and spoke with the pt and she stated understanding to the plan. She understands that if she doesn't have a blood clot to work on elevating and wear a compression stocking on her leg. She states understanding to this as well

## 2020-01-17 NOTE — Telephone Encounter (Signed)
See other message

## 2020-01-17 NOTE — Telephone Encounter (Signed)
Order placed in chart.

## 2020-01-17 NOTE — Addendum Note (Signed)
Addended by: Barbette Or on: 01/17/2020 08:15 AM   Modules accepted: Orders

## 2020-01-17 NOTE — Telephone Encounter (Signed)
Just saw this message, I do apologize.

## 2020-01-17 NOTE — Telephone Encounter (Signed)
Tiffany Stout- pt was scheduled today at [facility] 01/17/20 at 230pm [facility] will contact pt to scheduel./ss

## 2020-01-17 NOTE — Telephone Encounter (Signed)
Lvm for pt to call back to inform of plan

## 2020-01-20 ENCOUNTER — Other Ambulatory Visit: Payer: Self-pay | Admitting: Orthopaedic Surgery

## 2020-01-21 MED ORDER — OXYCODONE HCL 5 MG PO TABS
5.0000 mg | ORAL_TABLET | ORAL | 0 refills | Status: DC | PRN
Start: 2020-01-21 — End: 2020-01-25

## 2020-01-21 NOTE — Telephone Encounter (Signed)
Please advise 

## 2020-01-23 ENCOUNTER — Telehealth: Payer: Self-pay

## 2020-01-23 NOTE — Telephone Encounter (Signed)
There is really nothing stronger that we provide her knee replacement surgery that is stronger than oxycodone.  Also, she is now a month out from surgery.  She needs to take something with the oxycodone such as 3-4 Motrin or Advil at least 3 times a day with meals and that will help as well.  She can also take her muscle relaxant.

## 2020-01-23 NOTE — Telephone Encounter (Signed)
Patients husband called stating patient is in pain and hasn't been able to sleep patient stated oxycodone isn't helping and is  requesting something stronger to help. CB:[phone]

## 2020-01-24 ENCOUNTER — Telehealth: Payer: Self-pay | Admitting: Orthopaedic Surgery

## 2020-01-24 MED ORDER — KETOROLAC TROMETHAMINE 10 MG PO TABS: 10.0000 mg | ORAL_TABLET | Freq: Four times a day (QID) | ORAL | 0 refills | Status: DC | PRN

## 2020-01-24 MED ORDER — TIZANIDINE HCL 4 MG PO TABS: 4.0000 mg | ORAL_TABLET | Freq: Three times a day (TID) | ORAL | 1 refills | Status: AC | PRN

## 2020-01-24 NOTE — Telephone Encounter (Signed)
I sent in a stronger muscle relaxer to try as well as Toradol, which she can take the toradol every 6 hours for 4 days only.

## 2020-01-24 NOTE — Telephone Encounter (Signed)
Called and informed and pt stated understanding and would try this

## 2020-01-24 NOTE — Telephone Encounter (Signed)
LMOM for patient of the below message  

## 2020-01-24 NOTE — Telephone Encounter (Signed)
Pt already had u/s on 12/9 and was negative for DVT

## 2020-01-24 NOTE — Telephone Encounter (Signed)
Pt alled stating she had a knee replacement with Dr.Blackman and her pain levels are extremely high; pt states she would like to have a scan of her leg to try and figure out where this pain is coming from. She states the pain doesn't let up and it's even keeping her up at night. She would like a CB to discuss some different options she may have.   [phone]

## 2020-01-25 ENCOUNTER — Telehealth: Payer: Self-pay | Admitting: Orthopaedic Surgery

## 2020-01-25 MED ORDER — OXYCODONE HCL 5 MG PO TABS
5.0000 mg | ORAL_TABLET | ORAL | 0 refills | Status: AC | PRN
Start: 2020-01-25 — End: ?

## 2020-01-25 NOTE — Telephone Encounter (Signed)
Pt informed

## 2020-01-25 NOTE — Telephone Encounter (Signed)
Patient called. Says she is in a lot of pain. Would like some pain medication called in for her. Her call back number is [phone]

## 2020-01-25 NOTE — Telephone Encounter (Signed)
I sent in some more oxycodone

## 2020-01-28 ENCOUNTER — Telehealth: Payer: Self-pay | Admitting: Orthopaedic Surgery

## 2020-01-28 MED ORDER — KETOROLAC TROMETHAMINE 10 MG PO TABS: 10.0000 mg | ORAL_TABLET | Freq: Four times a day (QID) | ORAL | 0 refills | Status: AC | PRN

## 2020-01-28 NOTE — Telephone Encounter (Signed)
LMOM for patient of the below message  

## 2020-01-28 NOTE — Telephone Encounter (Signed)
Patient called requesting a refill of ketorolac. Please send to pharmacy on file. Please call patient when medication has been sent in. Patient phone number is

## 2020-01-28 NOTE — Telephone Encounter (Signed)
I could send in just 1 more prescription refill for this medication.  I will not refill it after that because it is very bad for the kidneys and can cause kidney damage if taken too much.

## 2020-02-04 ENCOUNTER — Ambulatory Visit (INDEPENDENT_AMBULATORY_CARE_PROVIDER_SITE_OTHER): Payer: No Typology Code available for payment source | Admitting: Orthopaedic Surgery

## 2020-02-04 ENCOUNTER — Encounter: Payer: Self-pay | Admitting: Orthopaedic Surgery

## 2020-02-04 DIAGNOSIS — Z96651 Presence of right artificial knee joint: Secondary | ICD-10-CM

## 2020-02-04 NOTE — Progress Notes (Signed)
The patient is now about 6 weeks status post a right total knee arthroplasty.  She is 8 months out from [place] replacing her left knee.  She is a very motivated 54 year old female and is anxious to return back to work.  However, she is not ready from my standpoint for that and her daughter agrees with this as well.  Examination of her right upper knee still shows some moderate swelling.  She has almost full extension actively and passively I can get her there.  Her flexion is just past 90 degrees.  She will continue to rehabilitate her right knee.  I would like to see her back in 3 weeks to see if we can get her close to returning to full work duties.  Her and her daughter agree with this treatment plan.  I also gave her a new work and disability note.

## 2020-02-25 ENCOUNTER — Ambulatory Visit: Payer: No Typology Code available for payment source | Admitting: Orthopaedic Surgery

## 2020-02-26 ENCOUNTER — Telehealth: Payer: Self-pay

## 2020-02-26 NOTE — Telephone Encounter (Signed)
We don't do those

## 2020-02-26 NOTE — Telephone Encounter (Signed)
Patient called she has an appointment 1/26 she wants to know if she can do a telehealth appt. If possible due to her having to return back to work. [phone]

## 2020-03-05 ENCOUNTER — Ambulatory Visit (INDEPENDENT_AMBULATORY_CARE_PROVIDER_SITE_OTHER): Payer: No Typology Code available for payment source | Admitting: Orthopaedic Surgery

## 2020-03-05 ENCOUNTER — Encounter: Payer: Self-pay | Admitting: Orthopaedic Surgery

## 2020-03-05 DIAGNOSIS — Z96651 Presence of right artificial knee joint: Secondary | ICD-10-CM

## 2020-03-05 NOTE — Progress Notes (Signed)
The patient is now 10 weeks status post a right total knee arthroplasty.  She says she has no pain is doing very well overall.  She would like to return to full work duties without restrictions starting tomorrow.  Examination right knee shows there is still some swelling to be expected.  Her extension is almost full and her flexion is to past 100 degrees.  Her knee feels loosely stable.  I agree with her returning to regular work duties starting tomorrow.  The next avenue to see her back in 6 months with an AP and lateral of her right knee.  If there is issues before then she will let us know.

## 2020-05-26 ENCOUNTER — Encounter: Payer: Self-pay | Admitting: Orthopaedic Surgery

## 2020-05-26 ENCOUNTER — Ambulatory Visit (INDEPENDENT_AMBULATORY_CARE_PROVIDER_SITE_OTHER): Payer: PRIVATE HEALTH INSURANCE | Admitting: Orthopaedic Surgery

## 2020-05-26 DIAGNOSIS — Z96652 Presence of left artificial knee joint: Secondary | ICD-10-CM | POA: Diagnosis not present

## 2020-05-26 DIAGNOSIS — Z96651 Presence of right artificial knee joint: Secondary | ICD-10-CM

## 2020-05-26 NOTE — Progress Notes (Signed)
The patient is seen as a work in today.  She injured her right knee yesterday when she slipped down some steps at a restaurant.  We replaced both her knees with the right knee going the more recent replacement about 5 months ago.  She is ambulating using cane and went to an outlying emergency room.  X-rays on the canopy system of the right knee for me to review.  She says she feels better today than she did yesterday.  On examination her right knee shows some swelling to be expected at 5 months postoperative.  It is not red.  Her extensor mechanism is intact and she has good range of motion.  The knee feels ligamentously stable.  X-rays on the canopy system of her right knee show well-seated implants with no complicating features.  I gave her reassurance that her knee replacement looks stable.  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  We will see her back in 3 months and at that visit we will have a standing AP and lateral of each knee.  All questions and concerns were answered and addressed.

## 2020-09-03 ENCOUNTER — Ambulatory Visit: Payer: No Typology Code available for payment source | Admitting: Orthopaedic Surgery

## 2020-10-17 ENCOUNTER — Ambulatory Visit: Payer: PRIVATE HEALTH INSURANCE | Admitting: Obstetrics and Gynecology

## 2021-04-22 IMAGING — DX DG KNEE 1-2V PORT*L*
2 series · 2 of 2 positions shown · non-contrast
Comparison: None

CLINICAL DATA: Follow-up total knee replacement

EXAM:
PORTABLE LEFT KNEE - 1-2 VIEW

[knee ap]
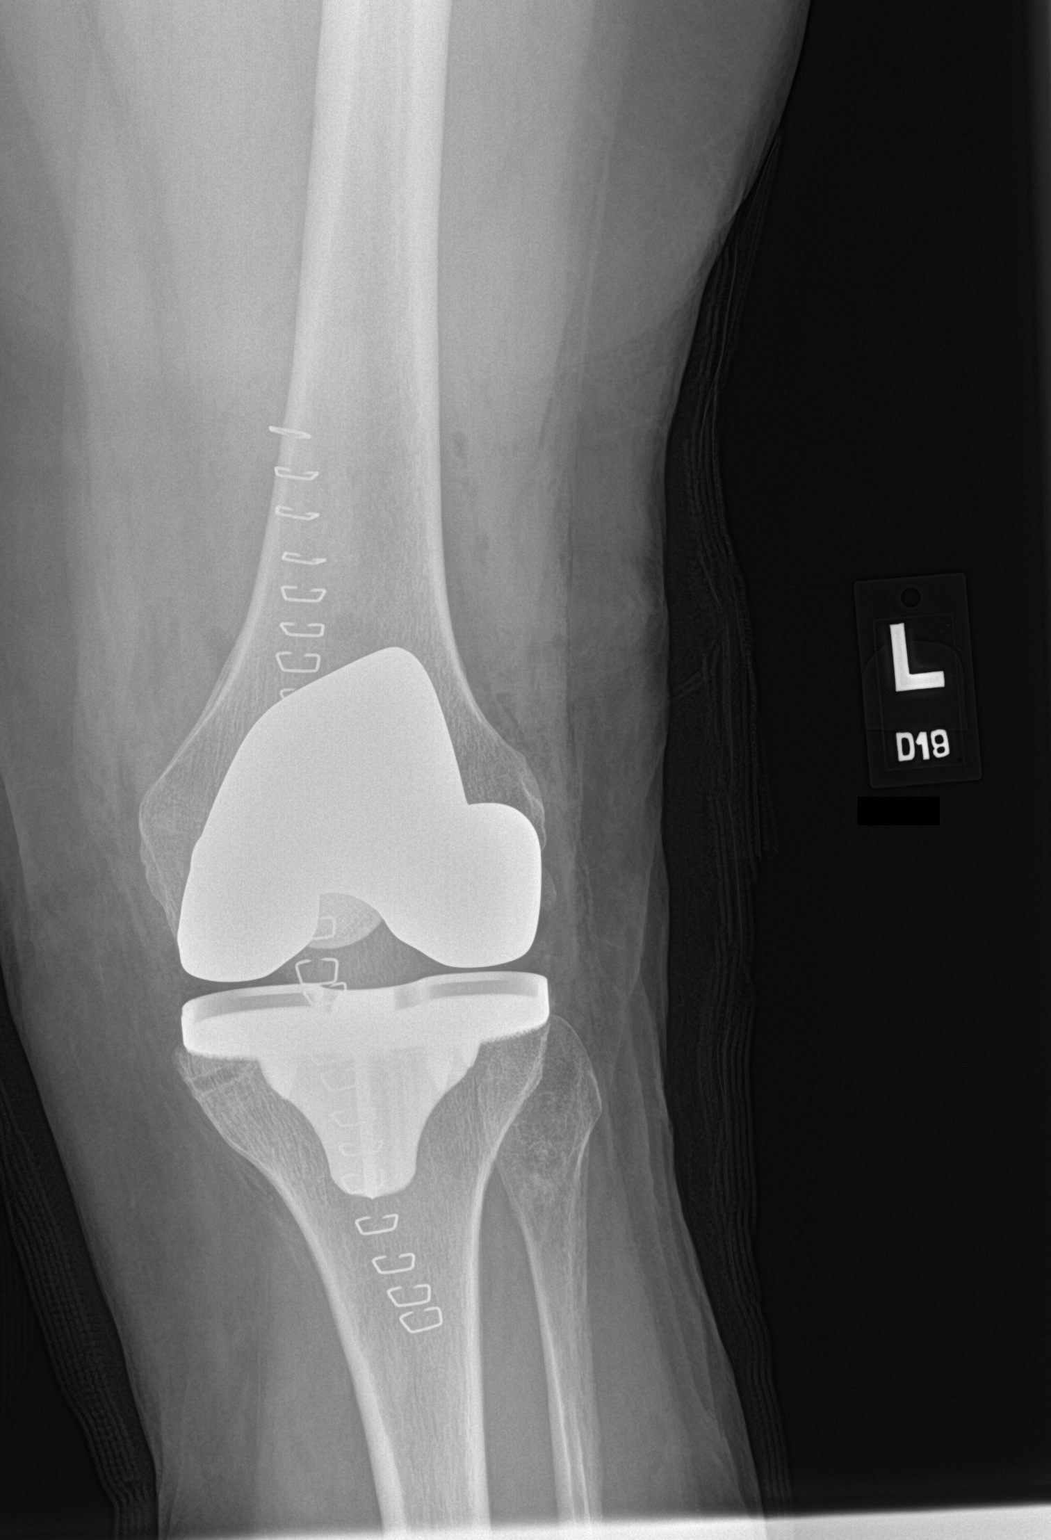

[knee lat]
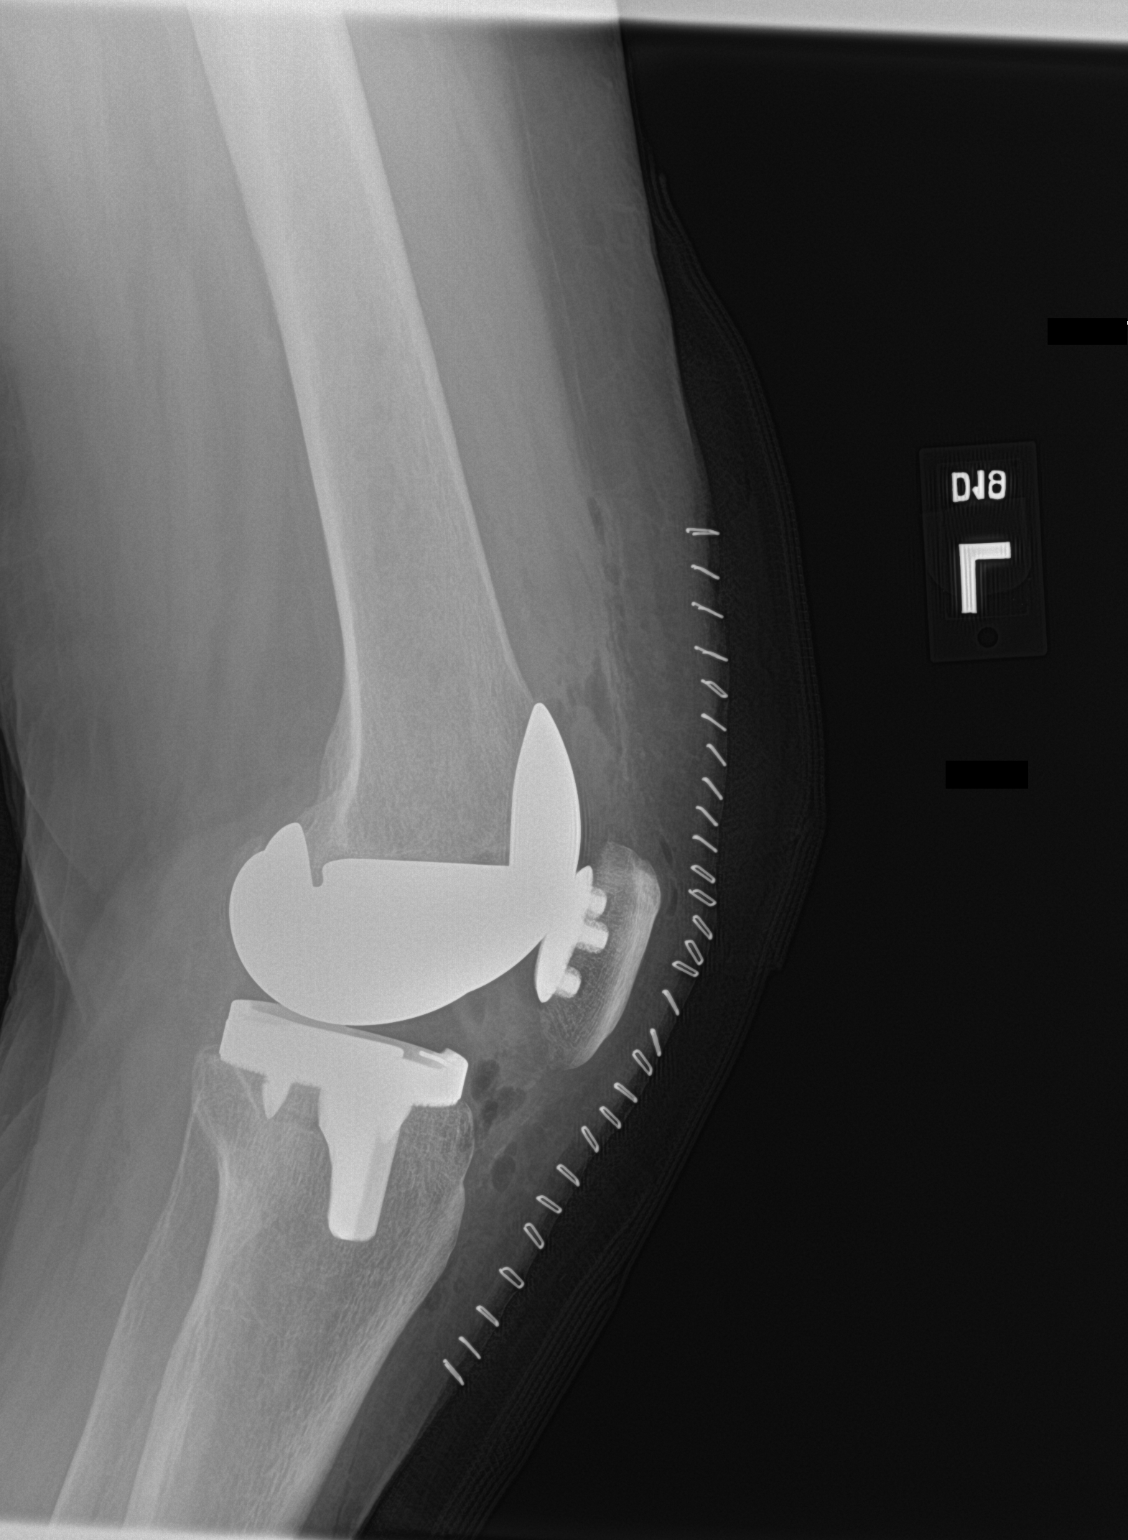

[2 of 2 positions shown; findings below may reference images not displayed]

FINDINGS: Two-view show total knee arthroplasty. Components appear well
positioned. No radiographically detectable complication.
IMPRESSION: Good appearance following total knee arthroplasty.

## 2021-06-07 ENCOUNTER — Ambulatory Visit
Admission: EM | Admit: 2021-06-07 | Discharge: 2021-06-07 | Disposition: A | Discharge status: Home / Self Care | Payer: No Typology Code available for payment source | Attending: Nurse Practitioner | Admitting: Nurse Practitioner

## 2021-06-07 DIAGNOSIS — B9789 Other viral agents as the cause of diseases classified elsewhere: Secondary | ICD-10-CM | POA: Diagnosis not present

## 2021-06-07 DIAGNOSIS — J019 Acute sinusitis, unspecified: Secondary | ICD-10-CM

## 2021-06-07 MED ORDER — PSEUDOEPH-BROMPHEN-DM 30-2-10 MG/5ML PO SYRP: 5.0000 mL | ORAL_SOLUTION | Freq: Four times a day (QID) | ORAL | 0 refills | Status: AC | PRN

## 2021-06-07 MED ORDER — CETIRIZINE HCL 10 MG PO TABS: 10.0000 mg | ORAL_TABLET | Freq: Every day | ORAL | 0 refills | Status: AC

## 2021-06-07 MED ORDER — AMOXICILLIN-POT CLAVULANATE 875-125 MG PO TABS: 1.0000 | ORAL_TABLET | Freq: Two times a day (BID) | ORAL | 0 refills | Status: AC

## 2021-06-07 NOTE — ED Provider Notes (Signed)
?[facility] ? ? ? ?CSN: [id number] ?Arrival date & time: 06/07/21  0946 ? ? ?  ? ?History   ?Chief Complaint ?Chief Complaint  ?Patient presents with  ? Cough  ? Nasal Congestion  ? ? ?HPI ?Tiffany Stout is a 56 y.o. female.  ? ?The patient is a 56 year old female who presents with upper respiratory symptoms.  Symptoms have been present for the past 24 hours.  She states that over the last evening, her symptoms rapidly worsened.  She complains of headache, nasal congestion, sinus pressure, and postnasal drainage.  She states she has also been sneezing a lot.  She denies fever, chills, ear pain, wheezing, shortness of breath, or GI symptoms.  Patient has been taking her husband Cipro, she took 2 doses.  She has not tried any other medication.  Patient reports a history of seasonal allergies.  States she was taking Claritin but stopped because it interfered with her sleep. She reports that she has Flonase, but has not used it. ? ?The history is provided by the patient.  ? ?Past Medical History:  ?Diagnosis Date  ? Anxiety   ? Arthritis   ? Fibromyalgia   ? GERD (gastroesophageal reflux disease)   ? no meds - diet controlled  ? History of blood transfusion   ? with hysterectomy surgery  ? Hypertension   ? no meds  ? IBS (irritable bowel syndrome)   ? ? ?Patient Active Problem List  ? Diagnosis Date Noted  ? 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 Status post total left knee replacement 06/05/2019  ? Unilateral primary osteoarthritis, left knee 04/09/2019  ? Unilateral primary osteoarthritis, right knee 04/09/2019  ? IBS (irritable bowel syndrome)   ? GERD (gastroesophageal reflux disease) 11/08/2011  ? ? ?Past Surgical History:  ?Procedure Laterality Date  ? ABDOMINAL HYSTERECTOMY    ? CHOLECYSTECTOMY    ? ESOPHAGOGASTRODUODENOSCOPY  11/11/2011  ? Procedure: ESOPHAGOGASTRODUODENOSCOPY (EGD);  Surgeon: Rogene Houston, MD;   Location: [facility];  Service: Endoscopy;  Laterality: N/A;  315  ? TONSILLECTOMY    ? TOTAL KNEE ARTHROPLASTY Left 06/05/2019  ? Procedure: LEFT TOTAL KNEE ARTHROPLASTY;  Surgeon: Mcarthur Rossetti, MD;  Location: [facility];  Service: Orthopedics;  Laterality: Left;  ? TOTAL KNEE ARTHROPLASTY Right 12/25/2019  ? Procedure: RIGHT TOTAL KNEE ARTHROPLASTY;  Surgeon: Mcarthur Rossetti, MD;  Location: [facility];  Service: Orthopedics;  Laterality: Right;  ? WISDOM TOOTH EXTRACTION    ? only 2 removed  ? ? ?OB History   ?No obstetric history on file. ?  ? ? ? ?Home Medications   ? ?Prior to Admission medications   ?Medication Sig Start Date End Date Taking? Authorizing Provider  ?amoxicillin-clavulanate (AUGMENTIN) 875-125 MG tablet Take 1 tablet by mouth every 12 (twelve) hours. 06/11/21  Yes Aliza Moret-Warren, Alda Lea, NP  ?brompheniramine-pseudoephedrine-DM 30-2-10 MG/5ML syrup Take 5 mLs by mouth 4 (four) times daily as needed. 06/07/21  Yes Treanna Dumler-Warren, Alda Lea, NP  ?cetirizine (ZYRTEC) 10 MG tablet Take 1 tablet (10 mg total) by mouth daily. 06/07/21  Yes Manisha Cancel-Warren, Alda Lea, NP  ?aspirin 81 MG chewable tablet Chew 1 tablet (81 mg total) by mouth 2 (two) times daily. 12/26/19   Mcarthur Rossetti, MD  ?Calcium Carb-Cholecalciferol (CALCIUM 600 + D PO) Take 1 tablet by mouth daily.    [provider]  ?clonazePAM (KLONOPIN) 1 MG tablet Take  1 mg by mouth at bedtime as needed (sleep).    [provider]  ?FLUoxetine (PROZAC) 20 MG capsule Take 20 mg by mouth daily. 12/12/19   [provider]  ?gabapentin (NEURONTIN) 100 MG capsule Take 1 capsule (100 mg total) by mouth 3 (three) times daily. 12/26/19   Kathryne HitchBlackman, Christopher Y, MD  ?ibuprofen (ADVIL) 200 MG tablet Take 600-800 mg by mouth every 6 (six) hours as needed for moderate pain.    [provider]  ?methocarbamol (ROBAXIN) 500 MG tablet Take 1 tablet (500 mg total) by mouth every 6 (six) hours as needed for  muscle spasms. 12/26/19   Kathryne HitchBlackman, Christopher Y, MD  ?oxyCODONE (OXY IR/ROXICODONE) 5 MG immediate release tablet Take 1-3 tablets (5-15 mg total) by mouth every 4 (four) hours as needed for moderate pain (pain score 4-6). 01/25/20   Kathryne HitchBlackman, Christopher Y, MD  ?tiZANidine (ZANAFLEX) 4 MG tablet Take 1 tablet (4 mg total) by mouth every 8 (eight) hours as needed for muscle spasms. 01/24/20   Kathryne HitchBlackman, Christopher Y, MD  ? ? ?Family History ?History reviewed. No pertinent family history. ? ?Social History ?Social History  ? ?Tobacco Use  ? Smoking status: Former  ?  Types: Cigarettes  ? Smokeless tobacco: Never  ? Tobacco comments:  ?  Quit 20 years ago  ?Vaping Use  ? Vaping Use: Never used  ?Substance Use Topics  ? Alcohol use: No  ? Drug use: No  ? ? ? ?Allergies   ?Morphine ? ? ?Review of Systems ?Review of Systems  ?Constitutional:  Positive for activity change, appetite change and fatigue. Negative for fever.  ?HENT:  Positive for congestion, postnasal drip, rhinorrhea, sinus pressure and sore throat. Negative for ear pain.   ?Eyes: Negative.   ?Respiratory:  Positive for cough. Negative for shortness of breath and wheezing.   ?Cardiovascular: Negative.   ?Gastrointestinal: Negative.   ?Skin: Negative.   ?Psychiatric/Behavioral: Negative.    ? ? ?Physical Exam ?Triage Vital Signs ?ED Triage Vitals  ?Enc Vitals Group  ?   BP 06/07/21 1040 (!) 138/95  ?   Pulse Rate 06/07/21 1040 83  ?   Resp 06/07/21 1040 20  ?   Temp 06/07/21 1040 97.8 ?F (36.6 ?C)  ?   Temp src --   ?   SpO2 06/07/21 1040 98 %  ?   Weight --   ?   Height --   ?   Head Circumference --   ?   Peak Flow --   ?   Pain Score 06/07/21 1039 8  ?   Pain Loc --   ?   Pain Edu? --   ?   Excl. in GC? --   ? ?No data found. ? ?Updated Vital Signs ?BP (!) 138/95   Pulse 83   Temp 97.8 ?F (36.6 ?C)   Resp 20   LMP  (LMP Unknown)   SpO2 98%  ? ?Visual Acuity ?Right Eye Distance:   ?Left Eye Distance:   ?Bilateral Distance:   ? ?Right Eye Near:    ?Left Eye Near:    ?Bilateral Near:    ? ?Physical Exam ?Vitals reviewed.  ?Constitutional:   ?   General: She is not in acute distress. ?   Appearance: Normal appearance.  ?HENT:  ?   Head: Normocephalic and atraumatic.  ?   Right Ear: Tympanic membrane, ear canal and external ear normal.  ?   Left Ear: Tympanic membrane, ear canal and  external ear normal.  ?   Nose: Congestion present.  ?   Right Turbinates: Enlarged and swollen.  ?   Left Turbinates: Enlarged and swollen.  ?   Right Sinus: Maxillary sinus tenderness present.  ?   Left Sinus: Maxillary sinus tenderness present.  ?   Mouth/Throat:  ?   Mouth: Mucous membranes are moist.  ?   Pharynx: Posterior oropharyngeal erythema present. No oropharyngeal exudate.  ?Eyes:  ?   Extraocular Movements: Extraocular movements intact.  ?   Conjunctiva/sclera: Conjunctivae normal.  ?Cardiovascular:  ?   Rate and Rhythm: Normal rate and regular rhythm.  ?   Pulses: Normal pulses.  ?   Heart sounds: Normal heart sounds.  ?Pulmonary:  ?   Effort: Pulmonary effort is normal.  ?   Breath sounds: Normal breath sounds.  ?Abdominal:  ?   General: Bowel sounds are normal.  ?   Palpations: Abdomen is soft.  ?   Tenderness: There is no abdominal tenderness.  ?Musculoskeletal:  ?   Cervical back: Normal range of motion.  ?Lymphadenopathy:  ?   Cervical: No cervical adenopathy.  ?Skin: ?   General: Skin is warm and dry.  ?Neurological:  ?   General: No focal deficit present.  ?   Mental Status: She is alert and oriented to person, place, and time.  ?Psychiatric:     ?   Mood and Affect: Mood normal.     ?   Behavior: Behavior normal.  ? ? ? ?UC Treatments / Results  ?Labs ?(all labs ordered are listed, but only abnormal results are displayed) ?Labs Reviewed - No data to display ? ?EKG ? ? ?Radiology ?No results found. ? ?Procedures ?Procedures (including critical care time) ? ?Medications Ordered in UC ?Medications - No data to display ? ?Initial Impression / Assessment and Plan /  UC Course  ?I have reviewed the triage vital signs and the nursing notes. ? ?Pertinent labs & imaging results that were available during my care of the patient were reviewed by me and considered in my medical decis

## 2021-06-07 NOTE — ED Triage Notes (Signed)
Pt presents with nasal congestion, cough and ear pain. Pt states she began taking husbands cipro , has taken 2 doses  ?

## 2021-06-07 NOTE — Discharge Instructions (Signed)
Take medication as prescribed. ?Stop using the Cipro that you are taking. ?Use the Flonase you currently have at home.  2 sprays in each nostril daily.  You can also use over-the-counter normal saline nasal spray to help with nasal congestion. ?Increase fluids and get plenty of rest. ?May take ibuprofen or Tylenol for pain, fever, or general discomfort. ?As discussed, we we will continue to monitor your symptoms are worsening with a watch and wait strategy.  If your symptoms do not begin to improve by 06/11/2021, you can pick up the antibiotic that has been sent to your pharmacy.  Take as directed at that time. ?Follow-up if symptoms worsen or do not improve. ?

## 2021-11-11 IMAGING — DX DG KNEE 1-2V PORT*R*
2 series · 2 of 2 positions shown · non-contrast
Comparison: None.

CLINICAL DATA: Post knee replacement

EXAM:
PORTABLE RIGHT KNEE - 1-2 VIEW

[knee ap]
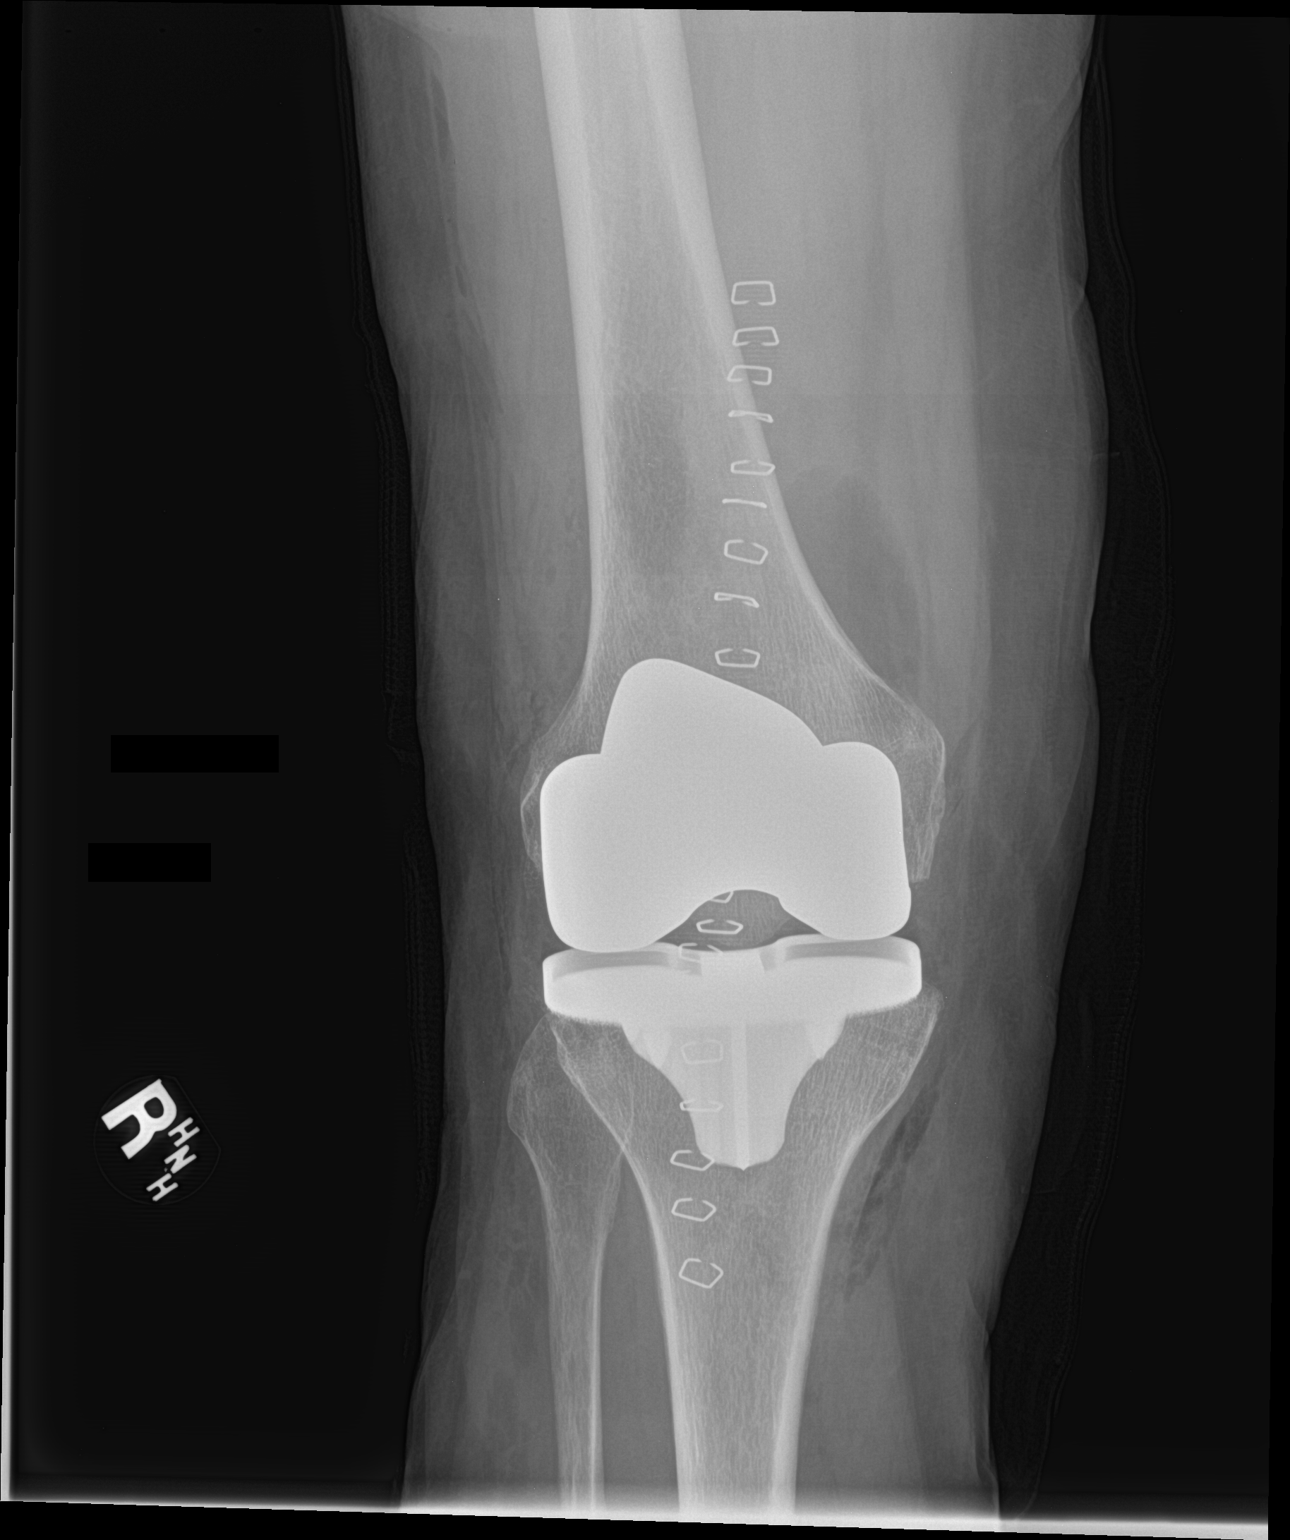

[knee lat]
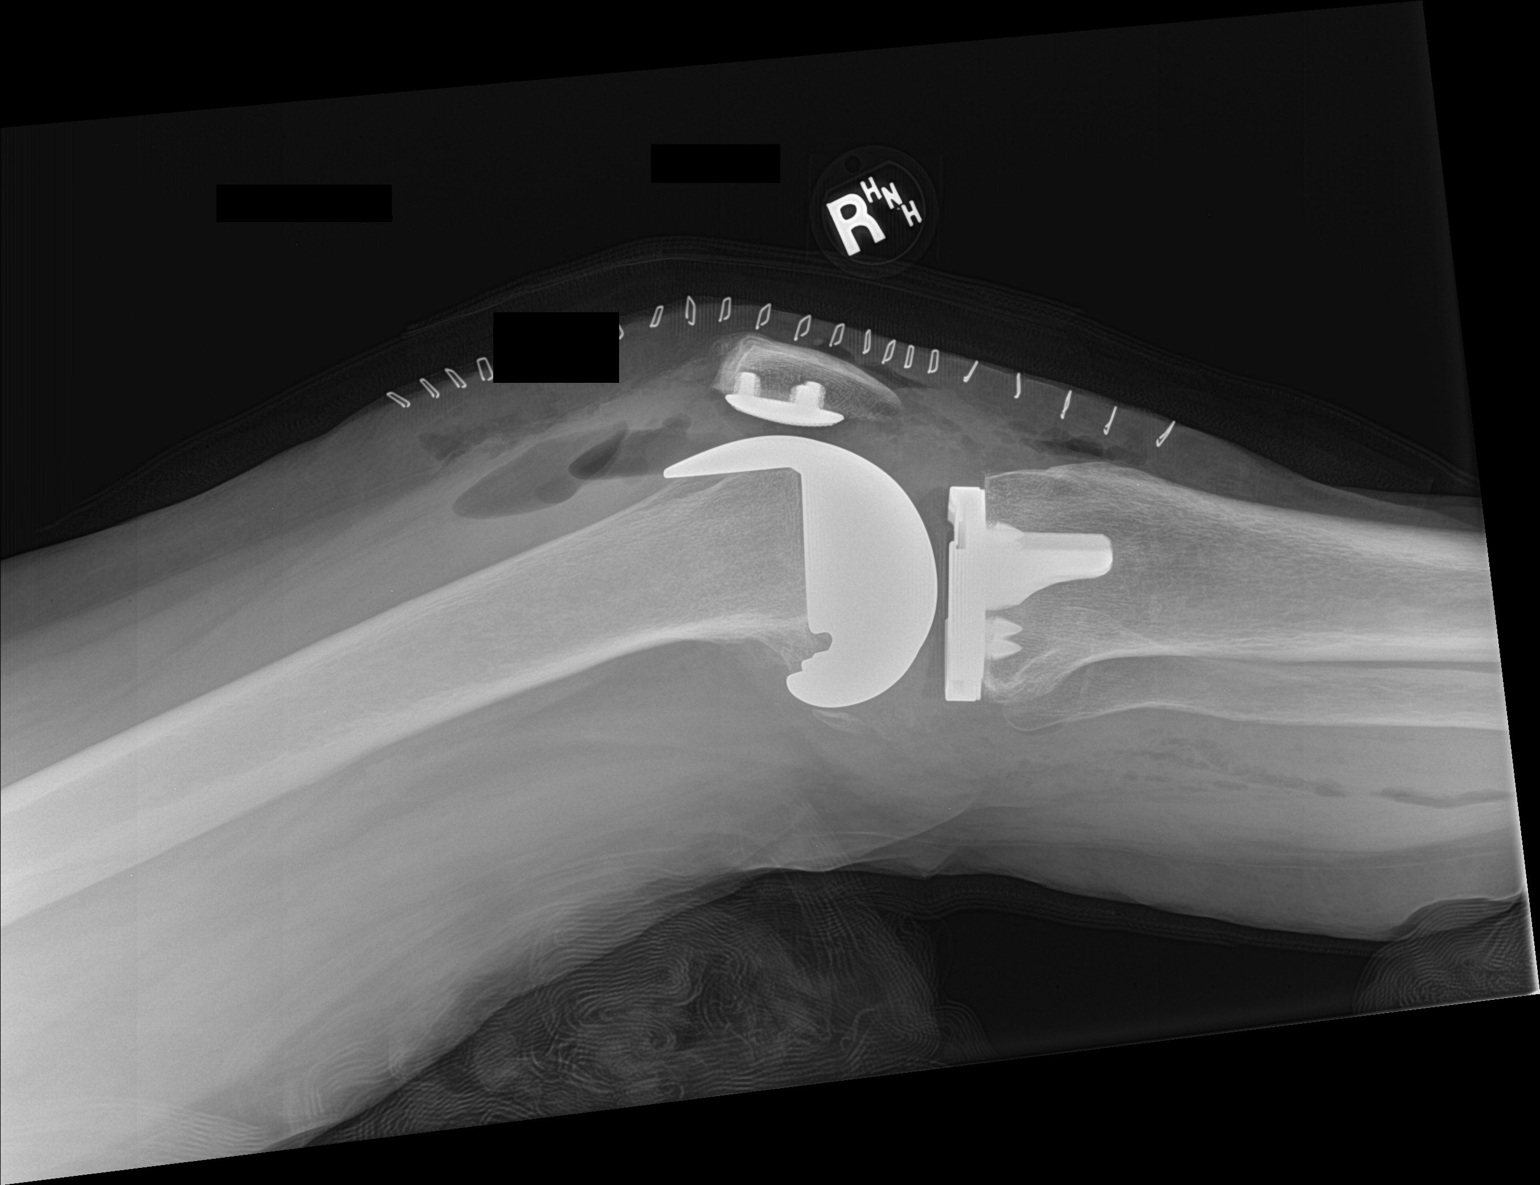

[2 of 2 positions shown; findings below may reference images not displayed]

FINDINGS: There are postoperative changes right total knee arthroplasty.
Postoperative soft tissue swelling and air present. No evidence of
complication.
IMPRESSION: Standard postoperative appearance of right total knee arthroplasty.

## 2021-11-30 ENCOUNTER — Encounter: Payer: Self-pay | Admitting: *Deleted
